# Patient Record
Sex: Female | Born: 1965 | Race: White | Hispanic: No | Marital: Married | Smoking: Current every day smoker
Health system: Southern US, Community
[De-identification: ages and names within clinical notes are randomized; demographics above are authoritative.]

## PROBLEM LIST (undated history)

## (undated) DIAGNOSIS — R519 Headache, unspecified: Secondary | ICD-10-CM

## (undated) DIAGNOSIS — R109 Unspecified abdominal pain: Secondary | ICD-10-CM

## (undated) DIAGNOSIS — K5790 Diverticulosis of intestine, part unspecified, without perforation or abscess without bleeding: Secondary | ICD-10-CM

## (undated) DIAGNOSIS — D219 Benign neoplasm of connective and other soft tissue, unspecified: Secondary | ICD-10-CM

## (undated) DIAGNOSIS — D071 Carcinoma in situ of vulva: Secondary | ICD-10-CM

## (undated) DIAGNOSIS — B009 Herpesviral infection, unspecified: Secondary | ICD-10-CM

## (undated) DIAGNOSIS — Z9889 Other specified postprocedural states: Secondary | ICD-10-CM

## (undated) DIAGNOSIS — N898 Other specified noninflammatory disorders of vagina: Secondary | ICD-10-CM

## (undated) DIAGNOSIS — N951 Menopausal and female climacteric states: Secondary | ICD-10-CM

## (undated) DIAGNOSIS — F410 Panic disorder [episodic paroxysmal anxiety] without agoraphobia: Secondary | ICD-10-CM

## (undated) DIAGNOSIS — R112 Nausea with vomiting, unspecified: Secondary | ICD-10-CM

## (undated) DIAGNOSIS — R102 Pelvic and perineal pain: Secondary | ICD-10-CM

## (undated) DIAGNOSIS — N809 Endometriosis, unspecified: Secondary | ICD-10-CM

## (undated) DIAGNOSIS — E079 Disorder of thyroid, unspecified: Secondary | ICD-10-CM

## (undated) DIAGNOSIS — Z720 Tobacco use: Secondary | ICD-10-CM

## (undated) DIAGNOSIS — R51 Headache: Secondary | ICD-10-CM

## (undated) DIAGNOSIS — N946 Dysmenorrhea, unspecified: Secondary | ICD-10-CM

## (undated) DIAGNOSIS — Q749 Unspecified congenital malformation of limb(s): Secondary | ICD-10-CM

## (undated) DIAGNOSIS — K635 Polyp of colon: Secondary | ICD-10-CM

## (undated) DIAGNOSIS — G8929 Other chronic pain: Secondary | ICD-10-CM

## (undated) DIAGNOSIS — K219 Gastro-esophageal reflux disease without esophagitis: Secondary | ICD-10-CM

## (undated) HISTORY — DX: Disorder of thyroid, unspecified: E07.9

## (undated) HISTORY — DX: Tobacco use: Z72.0

## (undated) HISTORY — DX: Dysmenorrhea, unspecified: N94.6

## (undated) HISTORY — DX: Benign neoplasm of connective and other soft tissue, unspecified: D21.9

## (undated) HISTORY — DX: Herpesviral infection, unspecified: B00.9

## (undated) HISTORY — DX: Endometriosis, unspecified: N80.9

## (undated) HISTORY — DX: Other specified noninflammatory disorders of vagina: N89.8

## (undated) HISTORY — DX: Panic disorder (episodic paroxysmal anxiety): F41.0

## (undated) HISTORY — DX: Menopausal and female climacteric states: N95.1

## (undated) HISTORY — DX: Unspecified congenital malformation of limb(s): Q74.9

## (undated) HISTORY — PX: HAND SURGERY: SHX662

## (undated) HISTORY — PX: OOPHORECTOMY: SHX86

## (undated) HISTORY — DX: Pelvic and perineal pain: R10.2

## (undated) HISTORY — DX: Unspecified abdominal pain: R10.9

## (undated) HISTORY — DX: Other chronic pain: G89.29

## (undated) HISTORY — DX: Carcinoma in situ of vulva: D07.1

## (undated) HISTORY — DX: Headache, unspecified: R51.9

## (undated) HISTORY — DX: Gastro-esophageal reflux disease without esophagitis: K21.9

## (undated) HISTORY — DX: Headache: R51

## (undated) HISTORY — PX: TUBAL LIGATION: SHX77

## (undated) HISTORY — PX: VAGINAL HYSTERECTOMY: SUR661

---

## 2007-07-24 ENCOUNTER — Ambulatory Visit: Payer: Self-pay | Admitting: Family Medicine

## 2009-12-27 ENCOUNTER — Emergency Department: Payer: Self-pay | Admitting: Emergency Medicine

## 2010-03-21 ENCOUNTER — Emergency Department: Payer: Self-pay | Admitting: Unknown Physician Specialty

## 2010-03-24 ENCOUNTER — Ambulatory Visit: Payer: Self-pay | Admitting: Internal Medicine

## 2010-03-25 ENCOUNTER — Ambulatory Visit: Payer: Self-pay | Admitting: Internal Medicine

## 2010-03-30 ENCOUNTER — Ambulatory Visit: Payer: Self-pay | Admitting: Internal Medicine

## 2010-10-22 HISTORY — PX: OTHER SURGICAL HISTORY: SHX169

## 2011-02-01 ENCOUNTER — Ambulatory Visit: Payer: Self-pay | Admitting: Internal Medicine

## 2011-02-20 ENCOUNTER — Ambulatory Visit: Payer: Self-pay | Admitting: Obstetrics and Gynecology

## 2011-02-26 ENCOUNTER — Ambulatory Visit: Payer: Self-pay | Admitting: Obstetrics and Gynecology

## 2011-03-05 LAB — PATHOLOGY REPORT

## 2011-04-02 ENCOUNTER — Ambulatory Visit: Payer: Self-pay | Admitting: Internal Medicine

## 2011-05-10 ENCOUNTER — Ambulatory Visit: Payer: Self-pay | Admitting: Obstetrics and Gynecology

## 2011-06-05 ENCOUNTER — Ambulatory Visit: Payer: Self-pay | Admitting: Obstetrics and Gynecology

## 2011-06-11 ENCOUNTER — Ambulatory Visit: Payer: Self-pay | Admitting: Obstetrics and Gynecology

## 2011-07-14 ENCOUNTER — Emergency Department: Payer: Self-pay | Admitting: Emergency Medicine

## 2011-07-16 ENCOUNTER — Ambulatory Visit (INDEPENDENT_AMBULATORY_CARE_PROVIDER_SITE_OTHER): Payer: Self-pay | Admitting: Internal Medicine

## 2011-07-16 ENCOUNTER — Encounter: Payer: Self-pay | Admitting: Internal Medicine

## 2011-07-16 VITALS — BP 112/82 | HR 92 | Temp 98.9°F | Resp 16 | Ht 62.0 in | Wt 127.2 lb

## 2011-07-16 DIAGNOSIS — Q749 Unspecified congenital malformation of limb(s): Secondary | ICD-10-CM

## 2011-07-16 DIAGNOSIS — E039 Hypothyroidism, unspecified: Secondary | ICD-10-CM

## 2011-07-16 DIAGNOSIS — M25519 Pain in unspecified shoulder: Secondary | ICD-10-CM

## 2011-07-16 MED ORDER — PREDNISONE (PAK) 10 MG PO TABS: 10.0000 mg | ORAL_TABLET | Freq: Every day | ORAL | Status: AC

## 2011-07-16 MED ORDER — HYDROCODONE-ACETAMINOPHEN 5-500 MG PO TABS: 1.0000 | ORAL_TABLET | ORAL | Status: DC | PRN

## 2011-07-16 NOTE — Progress Notes (Signed)
Subjective:    Patient ID: Kayla Sellers, female    DOB: August 18, 1966, 45 y.o.   MRN: [id number]  HPI [name] is a 45 year old female who presents for an acute visit complaining of right shoulder pain which radiates down her right arm. She reports this pain began suddenly on Thursday, approximately 5 days ago. She initially thought she had just slept on her shoulder wrong and took ibuprofen. The pain became progressively worse and she started to develop numbness down her right arm. She ultimately went to the emergency room on Saturday where she reports having a chest x-ray which was normal. She was sent home with Vicodin and told to followup with her primary care physician. She reports that the pain has been severe since that time. She continues to have numbness and pain extending down her right arm. Her strength in her upper right arm seems intact. 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 She has been taking ibuprofen and Vicodin for severe pain. She reports feeling drowsy because of the Vicodin. She denies any fever or chills. She denies any other complaints.  Outpatient Encounter Prescriptions as of 07/16/2011  Medication Sig Dispense Refill  . HYDROcodone-acetaminophen (VICODIN) 5-500 MG per tablet Take 1 tablet by mouth every 4 (four) hours as needed.  60 tablet  3  . ibuprofen (ADVIL,MOTRIN) 800 MG tablet Take 800 mg by mouth every 8 (eight) hours as needed.        Marland Kitchen levothyroxine (SYNTHROID, LEVOTHROID) 137 MCG tablet Take 137 mcg by mouth daily.        Marland Kitchen OMEPRAZOLE PO Take 1 tablet by mouth daily.        . valACYclovir (VALTREX) 500 MG tablet Take 500 mg by mouth as needed.          Review of Systems  Constitutional: Negative for fever, chills and fatigue.  HENT: Positive for neck pain.   Musculoskeletal: Positive for myalgias, back pain and arthralgias.  Neurological: Positive for numbness. Negative for dizziness, tremors and weakness.   BP 112/82  Pulse  92  Temp(Src) 98.9 F (37.2 C) (Oral)  Resp 16  Ht 5\' 2"  (1.575 m)  Wt 127 lb 4 oz (57.72 kg)  BMI 23.27 kg/m2  SpO2 98%     Objective:   Physical Exam  Constitutional: She appears well-developed and well-nourished. She appears distressed.  Musculoskeletal: Normal range of motion.       Right shoulder: She exhibits pain. She exhibits no tenderness, no bony tenderness, no swelling, no effusion, no deformity and normal strength.       Right elbow: She exhibits normal range of motion, no swelling, no effusion and no deformity. no tenderness found.       Right wrist: She exhibits deformity. She exhibits normal range of motion, no tenderness and no bony tenderness.       Arms: Neurological: She is alert. She displays no atrophy and no tremor. No sensory deficit. She exhibits normal muscle tone.  Skin: She is not diaphoretic.          Assessment & Plan:  1. Right arm pain and numbness - patient with severe pain extending from her right neck down her right arm. She also complains of numbness in her distal arm. Exam is remarkable for 5 out of 5 strength in the shoulder abductors and abductors as well as the biceps and triceps. 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 Sensation  is grossly intact in the right arm. Question if she may have ruptured disc within her cervical spine with radiculopathy. She has had minimal to no improvement with nonsteroidals and Vicodin for severe pain. She is not able to work because of the pain. We will plan to get an MRI of the cervical spine for additional evaluation. Will treat with a prednisone pack to see if any improvement in symptoms. She will continue Vicodin for severe pain. She will followup in one week.

## 2011-07-18 ENCOUNTER — Telehealth: Payer: Self-pay | Admitting: Internal Medicine

## 2011-07-18 NOTE — Telephone Encounter (Signed)
One alternative would be for [place] to set her up at [facility] for evaluation of her shoulder and arm pain.  They could probably see her in the next few days.

## 2011-07-18 NOTE — Telephone Encounter (Signed)
I wanted to get an MRI of the cervical spine for this patient. Can we try to get this soon? She is calling complaining of persistent severe pain.  Also, we can write for stronger pain medication if necessary. She was already taking vicodin, but we can try percocet if she would like. She will not be able to work and take this.

## 2011-07-18 NOTE — Telephone Encounter (Signed)
Pt called back to check to see if referral was.  She would rather not take percocet  she wants to know if she can take a cortazone shot .   Could you please call pt [phone]  Pt is upset that is taking so long to get referral

## 2011-07-18 NOTE — Telephone Encounter (Signed)
Informed pt. That the alternative would be to refer her to [facility] clinic for eval of her shoulder and pain.  She is upset that her MRI hasn't been setup or the insurance hasn't been called to approve the MRI.  I informed her that I would talk with [facility] tomorrow and see the status on this and give her a call back.

## 2011-07-18 NOTE — Telephone Encounter (Signed)
Informed patient that we wanted to get a MRI of the cervical spine for this patient.  I told her we could try a stronger pain medication like percocet but she couldn't work and take this.  She stated she hasn't worked all week.  She said that was fine but she did ask about taking steroid shot to help relieve some of the pain.  Please advise.

## 2011-07-18 NOTE — Telephone Encounter (Signed)
Pt is still hurting she has taken the pretazone 6 yesterday 5 for today and not feeling any better.

## 2011-07-19 ENCOUNTER — Telehealth: Payer: Self-pay | Admitting: Internal Medicine

## 2011-07-19 NOTE — Telephone Encounter (Signed)
I just spoke with Kayla Sellers and explained to her that I was working on the referrals just as quickly as I could.  She said well if you were in pain you would be aggravating some people as well.  I told her I would call the insurance company right now to see if i could get this pre-cert.  She then said "If you don't have time to do my referral I can go some where else" I do like Dr. Dan Humphreys, but I am in pain.

## 2011-07-20 ENCOUNTER — Ambulatory Visit: Payer: Self-pay | Admitting: Internal Medicine

## 2011-07-20 ENCOUNTER — Telehealth: Payer: Self-pay | Admitting: *Deleted

## 2011-07-20 NOTE — Telephone Encounter (Signed)
Patient has an appointment at the hospital on the 28th she is aware of appointment.

## 2011-07-20 NOTE — Telephone Encounter (Signed)
Patient is calling to see if you have received her MRI results and also says that she is still having problem with constipation and thinks that is from the vicodin. She is asking if there are any other options for her besides vicodin. Says that she does not want percocet. Please advise.

## 2011-07-20 NOTE — Telephone Encounter (Signed)
Informed pt .

## 2011-07-20 NOTE — Telephone Encounter (Signed)
I have not received MRI results. Any narcotic medications will cause constipation, so would recommend using them sparingly and using laxative such as miralax as needed.

## 2011-07-23 ENCOUNTER — Telehealth: Payer: Self-pay | Admitting: Internal Medicine

## 2011-07-23 NOTE — Telephone Encounter (Signed)
Please request this from hospital, as I do not have it.

## 2011-07-23 NOTE — Telephone Encounter (Signed)
Pt would like results of mri done last week

## 2011-07-24 ENCOUNTER — Telehealth: Payer: Self-pay | Admitting: Internal Medicine

## 2011-07-24 ENCOUNTER — Other Ambulatory Visit: Payer: Self-pay | Admitting: Internal Medicine

## 2011-07-24 DIAGNOSIS — M542 Cervicalgia: Secondary | ICD-10-CM

## 2011-07-24 NOTE — Telephone Encounter (Signed)
Message copied by Virgina Evener on Tue Jul 24, 2011  3:29 PM ------      Message from: Ronna Polio A      Created: Mon Jul 23, 2011  5:25 PM      Regarding: MRI Cervical Spine       MRI showed several areas of nerve compression, most prominently at C6-7.  We should set her up with neurosurgery.

## 2011-07-24 NOTE — Telephone Encounter (Signed)
The reason it took awhile to get the MRI set up was because I had to do a peer to peer review with another doctor to get her insurance company to approve it.  I will put order in for referral to neurosurgery and then either we can set up referral, or she can set up on her own.

## 2011-07-24 NOTE — Telephone Encounter (Signed)
Message copied by Virgina Evener on Tue Jul 24, 2011  3:33 PM ------      Message from: Ronna Polio A      Created: Mon Jul 23, 2011  5:25 PM      Regarding: MRI Cervical Spine       MRI showed several areas of nerve compression, most prominently at C6-7.  We should set her up with neurosurgery.

## 2011-07-24 NOTE — Telephone Encounter (Signed)
Informed patient that her MRI showed several areas of nerve compression, most prominently at C6-7  And we would like to set her up with neurosurgery.  She states she doesn't trust Carollee Herter because it took her so long to set the MRI up and she didn't answer her calls.  She said is it going to take five years for her to set up neurology.  The order isn't in her chart so do you put in the order or I.  Thanks

## 2011-07-24 NOTE — Telephone Encounter (Signed)
I reviewed MRI and sent a message that she needs to see neurosurgery. Can you please make sure this is set up?

## 2011-07-25 ENCOUNTER — Telehealth: Payer: Self-pay | Admitting: Internal Medicine

## 2011-07-25 NOTE — Telephone Encounter (Signed)
Patient came by and I advised her what Dr. Dan Humphreys had documented patient wanted to go ahead and get a referral made to [facility].  I went ahead and faxed over information I didn't send MRI results since I didn't have it and she was waiting for me to fill out the referral sheet and watched me fax it.  She said that she was in pain and she didn't want to wait a month to get this appointment set up. I advised her that I would call [facility] on Friday and check the status with them.  I did advise her that it might take up to a week before they could give me an appt.

## 2011-07-25 NOTE — Telephone Encounter (Signed)
Informed patient of results of MRI.

## 2011-07-25 NOTE — Telephone Encounter (Signed)
Kayla Sellers is working on her referral.

## 2011-07-25 NOTE — Telephone Encounter (Signed)
Message copied by Virgina Evener on Wed Jul 25, 2011  6:09 PM ------      Message from: Ronna Polio A      Created: Mon Jul 23, 2011  5:25 PM      Regarding: MRI Cervical Spine       MRI showed several areas of nerve compression, most prominently at C6-7.  We should set her up with neurosurgery.

## 2011-07-31 ENCOUNTER — Encounter: Payer: Self-pay | Admitting: Internal Medicine

## 2011-10-23 HISTORY — PX: OTHER SURGICAL HISTORY: SHX169

## 2011-10-26 ENCOUNTER — Other Ambulatory Visit: Payer: 59

## 2011-10-29 ENCOUNTER — Encounter: Payer: 59 | Admitting: Internal Medicine

## 2012-02-13 ENCOUNTER — Ambulatory Visit: Payer: Self-pay | Admitting: Unknown Physician Specialty

## 2012-02-14 LAB — PATHOLOGY REPORT

## 2012-02-18 ENCOUNTER — Ambulatory Visit: Payer: Self-pay | Admitting: Obstetrics and Gynecology

## 2012-02-18 LAB — CBC
HCT: 44.5 % (ref 35.0–47.0)
HGB: 15.1 g/dL (ref 12.0–16.0)
MCH: 31.9 pg (ref 26.0–34.0)
MCV: 94 fL (ref 80–100)
RBC: 4.74 10*6/uL (ref 3.80–5.20)
WBC: 7.5 10*3/uL (ref 3.6–11.0)

## 2012-02-25 ENCOUNTER — Ambulatory Visit: Payer: Self-pay | Admitting: Obstetrics and Gynecology

## 2012-02-29 LAB — PATHOLOGY REPORT

## 2012-04-02 ENCOUNTER — Ambulatory Visit: Payer: Self-pay | Admitting: Obstetrics and Gynecology

## 2012-05-13 ENCOUNTER — Ambulatory Visit: Payer: Self-pay | Admitting: Gynecologic Oncology

## 2012-05-22 ENCOUNTER — Ambulatory Visit: Payer: Self-pay | Admitting: Gynecologic Oncology

## 2014-05-13 LAB — HM PAP SMEAR: HM PAP: NEGATIVE

## 2014-08-11 ENCOUNTER — Ambulatory Visit: Payer: Self-pay | Admitting: Obstetrics and Gynecology

## 2014-08-20 ENCOUNTER — Ambulatory Visit: Payer: Self-pay | Admitting: Obstetrics and Gynecology

## 2014-08-24 ENCOUNTER — Ambulatory Visit: Payer: Self-pay | Admitting: Obstetrics and Gynecology

## 2014-08-24 LAB — HM MAMMOGRAPHY

## 2014-09-01 ENCOUNTER — Emergency Department: Payer: Self-pay | Admitting: Emergency Medicine

## 2014-09-01 ENCOUNTER — Ambulatory Visit: Payer: Self-pay

## 2014-09-23 ENCOUNTER — Ambulatory Visit: Payer: Self-pay | Admitting: Internal Medicine

## 2015-02-13 NOTE — H&P (Signed)
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  HISTORY OF PRESENT ILLNESS: Tinleigh is a 49 year old white female in a monogamous relationship, status post transvaginal hysterectomy secondary to symptomatic endometriosis who presents now for wide local excision of vulvar lesion. The patient had lesion noted just to the right of the posterior fourchette that was biopsy proven to be VIN 3/possible CIS. Margins were involved. The patient has since then undergone colposcopy of the vulva which revealed the VIN 3 lesion at the right posterior fourchette. There was a smaller lesion consistent with acetowhite epithelium approximately 1 cm in diameter just to the left of midline at the posterior fourchette. In addition, there was a faint acetowhite epithelium noted involving the medial aspect of the labia minora bilaterally. No unusual vessels were identified. No epithelial skin breakdown. The patient is occasionally symptomatic with some vulvar discomfort notable on the right side of the perineum only. She does not have any chronic itching. The patient is here for wide local excision/biopsy.   PAST MEDICAL HISTORY:  1. Hypothyroidism.  2. Chronic headaches.  3. Herpes genitalis.  4. Chronic abdominopelvic pain.  5. Dysmenorrhea.  6. Menorrhagia.  7. Endometriosis.  8. Panic disorder.  9. Right hand deformity, congenital.  10. Tobacco use disorder.   PAST SURGICAL HISTORY:  1. Right hand bone graft surgery.  2. Primary low cervical transverse cesarean section.  3. Bilateral tubal ligation.   4. Right salpingo-oophorectomy secondary to scar tissue and cyst.  5. Transvaginal hysterectomy August 2012.   PAST OB HISTORY: Para 1-1-0-2. G1 C-section at 26 weeks secondary to preterm labor. G2 VBAC 6 pounds.   PAST GYN HISTORY: Menarche age 56. The patient had no history of abnormal Pap smears. She  did have hysterectomy secondary to endometriosis. The patient does have herpes genitalis for which she uses Valtrex suppression.   FAMILY HISTORY: Negative for cancer of the colon, ovary, or breast.   SOCIAL HISTORY: The patient does smoke 1 pack of cigarettes a day. She does drink alcohol socially. She denies drug use. She works for [employer]: The patient denies recent illness. She denies coagulopathy.   PHYSICAL EXAMINATION:  VITAL SIGNS: Height 5 feet 2 inches, weight 131, blood pressure 108/74, heart rate 96, BMI 24.   GENERAL: The patient is a pleasant white female with normal affect. She is alert and oriented.   OROPHARYNX: Clear.   NECK: Supple. There is no thyromegaly or adenopathy.   LUNGS: Clear.   HEART: Regular rate and rhythm without murmur.   ABDOMEN: Soft and nontender. No organomegaly.   PELVIC:  Colposcopy of the vulva reveals acetowhite epithelium in the labia minora region bilaterally. There is a prominent acetowhite epithelium lesion in the right and left posterior fourchette region as previously described.       IMPRESSION: Wide local excision surgery with vulvar biopsies. Date of surgery 02/25/2012.    ____________________________ Alanda Slim. DeFrancesco, MD mad:bjt D: 02/18/2012 14:57:44 ET T: 02/18/2012 15:36:04 ET JOB#: [id number]  cc: Hassell Done A. DeFrancesco, MD, <Dictator> Alanda Slim DEFRANCESCO MD ELECTRONICALLY SIGNED 02/25/2012 22:38

## 2015-02-13 NOTE — Op Note (Signed)
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   OPERATIVE PROCEDURE:  1. Wide local excision x2.  2. Punch biopsy x2.   ANESTHESIA: General LMA.   SURGEON: Alanda Slim. Katrinka Herbison, MD   FIRST ASSISTANT: None.   INDICATIONS: The patient is a 49 year old white female who has vulvar biopsy-verified CIN-III/CIS of the vulva, who presents for surgical management through wide local excision. The patient has had preoperative colposcopy that did reveal a well-circumscribed lesion, approximately 1 cm in diameter, just to the right of the posterior fourchette. There also was a smaller 5 mm lesion seen just to the left of the posterior fourchette. In addition, the labia minora bilaterally demonstrated some acetowhite epithelial changes noted with placement of 5% acetic acid solution. The labia minora and majora were biopsied using 5 mm punch instruments.   DESCRIPTION OF PROCEDURE: The patient was brought to the Operating Room where she was placed in the supine position. General anesthesia was induced without difficulty using the LMA technique. She was placed in the dorsal lithotomy position using the candy cane stirrups. The perineum was draped in standard fashion. The 5% acetic acid was then used with 4 x 4's to soak the labia majora, minora and posterior fourchette region. Following several minutes of application of the acetic acid, the abnormal epithelium was noted as describecd. Wide local excision using a scalpel was performed on the 1 cm  right posterior fourchette lesion. The lesion was reapproximated using 4-0 Vicryl suture in a simple interrupted manner. Some Bovie cautery was used for hemostasis. The smaller 0.5 cm lesion to the left of the posterior  fourchette was likewise excised with a scalpel. Again, Bovie cautery was used to help with hemostasis; and subsequent use of 4-0 Vicryl suture in a simple interrupted technique was used to provide optimal hemostasis and wound closure. The labia minora abnormal changes were noted on the medial aspect of the labia, and  5 mm punch biopsy was performed x2 on the right and left labia majora, respectively. Bovie cautery again was used to facilitate hemostasis, along with simple interrupted sutures of 4-0 Vicry for skin closurel. Upon completion of the procedure, a straight catheter of 100 mL of urine was obtained with a 16-French red Robinson catheter. The perineum was cleaned, and the patient was awakened, mobilized, and taken to the recovery room in satisfactory condition.   ESTIMATED BLOOD LOSS: Minimal.   COMPLICATIONS: None.   COUNTS: All instruments, needle and sponge counts were verified as correct.   ____________________________ Alanda Slim. Netty Sullivant, MD mad:cbb D: 02/25/2012 15:37:12 ET T: 02/25/2012 16:37:56 ET JOB#: [id number]  cc: Hassell Done A. Deatra Mcmahen, MD, <Dictator> [facility] Alanda Slim Jahzion Brogden MD ELECTRONICALLY SIGNED 02/25/2012 22:44

## 2015-04-28 ENCOUNTER — Telehealth: Payer: Self-pay | Admitting: Obstetrics and Gynecology

## 2015-04-28 ENCOUNTER — Ambulatory Visit (INDEPENDENT_AMBULATORY_CARE_PROVIDER_SITE_OTHER): Payer: 59 | Admitting: Obstetrics and Gynecology

## 2015-04-28 VITALS — BP 113/77 | HR 80 | Temp 98.5°F | Ht 62.0 in | Wt 142.7 lb

## 2015-04-28 DIAGNOSIS — R309 Painful micturition, unspecified: Secondary | ICD-10-CM

## 2015-04-28 DIAGNOSIS — M545 Low back pain: Secondary | ICD-10-CM

## 2015-04-28 DIAGNOSIS — R319 Hematuria, unspecified: Secondary | ICD-10-CM

## 2015-04-28 LAB — POCT URINALYSIS DIPSTICK
BILIRUBIN UA: NEGATIVE
GLUCOSE UA: NEGATIVE
KETONES UA: NEGATIVE
Leukocytes, UA: NEGATIVE
NITRITE UA: NEGATIVE
Protein, UA: NEGATIVE
Spec Grav, UA: <=1.005
Urobilinogen, UA: 0.2
pH, UA: 6

## 2015-04-28 MED ORDER — NITROFURANTOIN MONOHYD MACRO 100 MG PO CAPS: 100.0000 mg | ORAL_CAPSULE | Freq: Two times a day (BID) | ORAL | Status: DC

## 2015-04-28 NOTE — Telephone Encounter (Signed)
Pt called office and TB put pt on nurse schedule for today.

## 2015-04-28 NOTE — Progress Notes (Signed)
Patient ID: Kayla Sellers, female   DOB: [birth year], 49 y.o.   MRN: [id number]   Pt states she has irritative voiding sx x 1 day. This a.m. she has low back pain and nauseous. NO h/o kidney stones. NO fevers.  U/a shows small blood.  Urine culture done. Pt aware to push fluids, ibup as needed, and use a heating pad. Sent in [facility]. Will f/u with culture results. Pt aware if she develops a fever, n/v/d, or severe abd pain she will need to go to the ER.   CMiller  Brayton Mars, MD

## 2015-04-28 NOTE — Telephone Encounter (Signed)
PT CALLED AND THINKS SHE HAS A UTI.Kayla Sellers IS HAVING BACK PAIN REALLY BAD AND SHE IS NAUSEA. PT WOULD LIKE A CALL BACK IN WHAT SHE NEEDS TO DO. I DID TELL HER THAT DR DE WAS OFF TODAY.

## 2015-04-29 LAB — URINE CULTURE: ORGANISM ID, BACTERIA: NO GROWTH

## 2015-05-03 ENCOUNTER — Encounter: Payer: Self-pay | Admitting: Obstetrics and Gynecology

## 2015-05-03 DIAGNOSIS — K5790 Diverticulosis of intestine, part unspecified, without perforation or abscess without bleeding: Secondary | ICD-10-CM | POA: Insufficient documentation

## 2015-05-03 DIAGNOSIS — F410 Panic disorder [episodic paroxysmal anxiety] without agoraphobia: Secondary | ICD-10-CM | POA: Insufficient documentation

## 2015-05-03 DIAGNOSIS — E079 Disorder of thyroid, unspecified: Secondary | ICD-10-CM | POA: Insufficient documentation

## 2015-05-03 DIAGNOSIS — R102 Pelvic and perineal pain: Secondary | ICD-10-CM

## 2015-05-03 DIAGNOSIS — N946 Dysmenorrhea, unspecified: Secondary | ICD-10-CM | POA: Insufficient documentation

## 2015-05-03 DIAGNOSIS — A6000 Herpesviral infection of urogenital system, unspecified: Secondary | ICD-10-CM | POA: Insufficient documentation

## 2015-05-03 DIAGNOSIS — G8929 Other chronic pain: Secondary | ICD-10-CM | POA: Insufficient documentation

## 2015-05-03 DIAGNOSIS — K635 Polyp of colon: Secondary | ICD-10-CM | POA: Insufficient documentation

## 2015-05-03 DIAGNOSIS — F17200 Nicotine dependence, unspecified, uncomplicated: Secondary | ICD-10-CM | POA: Insufficient documentation

## 2015-05-03 DIAGNOSIS — M21949 Unspecified acquired deformity of hand, unspecified hand: Secondary | ICD-10-CM | POA: Insufficient documentation

## 2015-05-03 DIAGNOSIS — R109 Unspecified abdominal pain: Secondary | ICD-10-CM

## 2015-05-03 DIAGNOSIS — N920 Excessive and frequent menstruation with regular cycle: Secondary | ICD-10-CM | POA: Insufficient documentation

## 2015-05-03 DIAGNOSIS — R51 Headache: Secondary | ICD-10-CM

## 2015-05-18 ENCOUNTER — Encounter: Payer: Self-pay | Admitting: Obstetrics and Gynecology

## 2015-05-23 ENCOUNTER — Telehealth: Payer: Self-pay | Admitting: Obstetrics and Gynecology

## 2015-05-23 NOTE — Telephone Encounter (Signed)
Pt is Dr Defrancesco's pt, she is having pain with urinating, burning and itching

## 2015-05-23 NOTE — Telephone Encounter (Signed)
Pt last visit was 04/28/15 when she was treated initially for UTI with Macrobid. Urine culture was negative. Pt states along with burning, pain with urination; she notes some itching and thick white discharge with odor. She has been taking Diflucan on and off for last month-takes another medication for toenail fungus. She also said she thought her boyfriend had been cheating on her and is concerned. Appt made for 10:30am tomorrow with Dr. Marcelline Mates.

## 2015-05-24 ENCOUNTER — Encounter: Payer: Self-pay | Admitting: Obstetrics and Gynecology

## 2015-05-24 ENCOUNTER — Ambulatory Visit (INDEPENDENT_AMBULATORY_CARE_PROVIDER_SITE_OTHER): Payer: 59 | Admitting: Obstetrics and Gynecology

## 2015-05-24 VITALS — BP 124/78 | HR 92 | Wt 139.4 lb

## 2015-05-24 DIAGNOSIS — A499 Bacterial infection, unspecified: Secondary | ICD-10-CM | POA: Diagnosis not present

## 2015-05-24 DIAGNOSIS — N760 Acute vaginitis: Secondary | ICD-10-CM

## 2015-05-24 DIAGNOSIS — B9689 Other specified bacterial agents as the cause of diseases classified elsewhere: Secondary | ICD-10-CM

## 2015-05-24 DIAGNOSIS — R399 Unspecified symptoms and signs involving the genitourinary system: Secondary | ICD-10-CM

## 2015-05-24 LAB — POCT URINALYSIS DIPSTICK
BILIRUBIN UA: NEGATIVE
Glucose, UA: NEGATIVE
KETONES UA: NEGATIVE
LEUKOCYTES UA: NEGATIVE
Nitrite, UA: NEGATIVE
PH UA: 6
PROTEIN UA: NEGATIVE
Spec Grav, UA: 1.015
UROBILINOGEN UA: NEGATIVE

## 2015-05-24 MED ORDER — FLUCONAZOLE 150 MG PO TABS: 150.0000 mg | ORAL_TABLET | Freq: Once | ORAL | Status: DC

## 2015-05-24 MED ORDER — METRONIDAZOLE 500 MG PO TABS: 500.0000 mg | ORAL_TABLET | Freq: Two times a day (BID) | ORAL | Status: DC

## 2015-05-24 NOTE — Progress Notes (Signed)
Patient ID: Kayla Sellers, female   DOB: [birth year], 49 y.o.   MRN: [id number] Having pain/burining with urination, itching and thick white vaginal discharge with odor. Pt suspects boyfriend of 75 ys has cheated on her.

## 2015-05-25 NOTE — Progress Notes (Signed)
GYNECOLOGY PROGRESS NOTE  Subjective:    Patient ID: Kayla Sellers, female    DOB: 1966/02/13, 49 y.o.   MRN: [id number]  HPI  Patient is a 49 y.o. No obstetric history on file. female who presents for complaints of dysuria and vaginal discharge over the past week.  Notes that the discharge is described as white, thick with an odor and associated itching.  Is worried about possible STD exposure.  Currently only has 1 partner of 12 years. Notes h/o UTIs and vaginitis in the past.   The following portions of the patient's history were reviewed and updated as appropriate: allergies, current medications, past family history, past medical history, past social history, past surgical history and problem list.  Review of Systems Pertinent items are noted in HPI.   Objective:   Blood pressure 124/78, pulse 92, weight 139 lb 7 oz (63.248 kg). General appearance: alert and no distress Abdomen: soft, non-tender; bowel sounds normal; no masses,  no organomegaly Pelvic: external genitalia normal and positive findings: vaginal discharge:  white, thick, odorless and small amount.  Cervix and uterus surgically absent. Adnexae nontender, non palpable.  Extremities: extremities normal, atraumatic, no cyanosis or edema.  Hypoplasia of right hand.  Neurologic: Grossly normal   Labs:   Urinalysis Results for Kayla Sellers (MRN [id number]) as of 05/25/2015 08:36  Ref. Range 05/24/2015 11:25  Bilirubin, UA Unknown negative  Clarity, UA Unknown clear  Color, UA Unknown yellow  Glucose Unknown negative  Ketones, UA Unknown negative  Leukocytes, UA Latest Ref Range: Negative  Negative  Nitrite, UA Unknown negative  pH, UA Unknown 6.0  Protein, UA Unknown negative  RBC, UA Unknown small  Specific Gravity, UA Unknown 1.015  Urobilinogen, UA Unknown negative    Microscopic wet-mount exam shows KOH done, clue cells. No trichomonads or yeast.    Assessment:   Bacterial vaginosis UTI  symptoms  Plan:   Will prescribe Flagyl 500 mg PO x 7 days. Will also prescribe dose of Diflucan to take as patient with h/o yeast infections after antibiotic use. UA negative, but will send for culture due to patient's h/o recurrent UTIs and urinary symptoms. F/u if symptoms worsen or fail to improve.    Rubie Maid, MD [facility]

## 2015-05-26 ENCOUNTER — Telehealth: Payer: Self-pay | Admitting: Obstetrics and Gynecology

## 2015-05-26 ENCOUNTER — Emergency Department
Admission: EM | Admit: 2015-05-26 | Discharge: 2015-05-26 | Discharge status: Against Medical Advice | Payer: 59 | Attending: Emergency Medicine | Admitting: Emergency Medicine

## 2015-05-26 ENCOUNTER — Encounter: Payer: Self-pay | Admitting: Emergency Medicine

## 2015-05-26 DIAGNOSIS — R600 Localized edema: Secondary | ICD-10-CM | POA: Diagnosis present

## 2015-05-26 DIAGNOSIS — R51 Headache: Secondary | ICD-10-CM | POA: Diagnosis not present

## 2015-05-26 DIAGNOSIS — Z720 Tobacco use: Secondary | ICD-10-CM | POA: Insufficient documentation

## 2015-05-26 LAB — URINE CULTURE: ORGANISM ID, BACTERIA: NO GROWTH

## 2015-05-26 MED ORDER — CLINDAMYCIN HCL 300 MG PO CAPS: 300.0000 mg | ORAL_CAPSULE | Freq: Two times a day (BID) | ORAL | Status: DC

## 2015-05-26 NOTE — ED Notes (Signed)
Patient to ER for c/o possible allergic reaction to medicine. Patient states she feels like her face is swelling. Denies any difficulty breathing. Patient reports headache. Face does not appear swollen, but patient reports sensation of swelling.

## 2015-05-26 NOTE — Telephone Encounter (Signed)
Kayla Sellers reacted to the flagyl medication,, after taking benadyrl her symptoms subsided. She wants to know what she needs to do now.

## 2015-05-26 NOTE — Telephone Encounter (Signed)
Pt states she took Benadryl x2, once before ER and once while there and her symptoms were improving. Talked with ER nurse and went home since she was better. Pt notified that uc negative and nu-swab results not back yet. New Rx sent to pharmacy per Dr. Marcelline Mates for Clindamycin 300 mg bid x7d. Pt encouraged to go to ER if any symptoms return, along with taking the Benadryl.

## 2015-05-27 ENCOUNTER — Other Ambulatory Visit: Payer: Self-pay | Admitting: Obstetrics and Gynecology

## 2015-05-27 LAB — NUSWAB VAGINITIS PLUS (VG+)
Atopobium vaginae: HIGH Score — AB
CANDIDA GLABRATA, NAA: NEGATIVE
CHLAMYDIA TRACHOMATIS, NAA: NEGATIVE
Megasphaera 1: HIGH Score — AB
NEISSERIA GONORRHOEAE, NAA: NEGATIVE

## 2015-06-02 ENCOUNTER — Telehealth: Payer: Self-pay | Admitting: Obstetrics and Gynecology

## 2015-06-02 MED ORDER — VALACYCLOVIR HCL 500 MG PO TABS: 500.0000 mg | ORAL_TABLET | Freq: Every day | ORAL | Status: DC

## 2015-06-02 NOTE — Telephone Encounter (Signed)
Pt aware nuswab showed bv. Pt can not have flagyl (allergic reaction) now on clindamycin with sx relief. Valtrex rx erx.

## 2015-06-02 NOTE — Telephone Encounter (Signed)
Patient called requesting results from her most recent lab work as well as a refill on valtrex. Thanks

## 2015-06-21 ENCOUNTER — Ambulatory Visit (INDEPENDENT_AMBULATORY_CARE_PROVIDER_SITE_OTHER): Payer: 59 | Admitting: Obstetrics and Gynecology

## 2015-06-21 ENCOUNTER — Encounter: Payer: Self-pay | Admitting: Obstetrics and Gynecology

## 2015-06-21 VITALS — BP 104/69 | HR 76 | Ht 62.0 in | Wt 142.0 lb

## 2015-06-21 DIAGNOSIS — R102 Pelvic and perineal pain: Secondary | ICD-10-CM

## 2015-06-21 DIAGNOSIS — Z1231 Encounter for screening mammogram for malignant neoplasm of breast: Secondary | ICD-10-CM

## 2015-06-21 DIAGNOSIS — N951 Menopausal and female climacteric states: Secondary | ICD-10-CM | POA: Diagnosis not present

## 2015-06-21 NOTE — Patient Instructions (Signed)
1.  Pap smear not done (not needed this year). 2.  Mammogram ordered. 3.  Routine labs should be obtained through primary care. 4.  FSH and serum estradiol level are drawn today to assess for menopause. 5.  Return in 1 year or when necessary.

## 2015-06-21 NOTE — Progress Notes (Signed)
Patient ID: Kayla Sellers, female   DOB: 1965/11/02, 49 y.o.   MRN: [id number] ANNUAL PREVENTATIVE CARE GYN  ENCOUNTER NOTE  Subjective:       Kayla Sellers is a 49 y.o. No obstetric history on file. female here for a routine annual gynecologic exam.  Current complaints: 1.  Vaginal dryness 2.  Dyspareunia  Kayla Sellers reports chronic right lower quadrant pelvic discomfort, which has been ongoing for months.  Bowel function is normal.  Bladder function is normal.  She did have colonoscopy in the past which revealed polyps.  She is status post laparoscopic RSO for chronic pelvic pain/endometriosis/pelvic adhesive disease.  She is having occasional early morning hot flashes.  She reports ongoing vaginal dryness and is comfortable with intercourse.   Gynecologic History No LMP recorded. Patient has had a hysterectomy. TVH Contraception: status post hysterectomy Last Pap: 7/ 2015 neg/neg. Results were: normal Last mammogram: 11/ 2015 birad 1. Results were: normal Status post RSO  Obstetric History OB History  No data available    Past Medical History  Diagnosis Date  . Limb deformities, congenital   . Thyroid disease     hypothyroidism  . VIN III (vulvar intraepithelial neoplasia III)     s/p wle  . Chronic pelvic pain in female   . Fibroid   . Dysmenorrhea   . Endometriosis   . Panic disorder   . HA (headache)   . Left flank pain   . HSV-2 (herpes simplex virus 2) infection   . Tobacco user   . Acid reflux   . Vaginal dryness   . Menopausal state     Past Surgical History  Procedure Laterality Date  . Wide local excision  2013    pos margin  . Vaginal hysterectomy    . Oophorectomy Right   . Hand surgery Right     bone graft  . Tubal ligation    . Cesarean section      Current Outpatient Prescriptions on File Prior to Visit  Medication Sig Dispense Refill  . ibuprofen (ADVIL,MOTRIN) 800 MG tablet Take 800 mg by mouth every 8 (eight) hours as needed.      Marland Kitchen  levothyroxine (SYNTHROID, LEVOTHROID) 137 MCG tablet Take 137 mcg by mouth daily.      Marland Kitchen OMEPRAZOLE PO Take 1 tablet by mouth daily.      . valACYclovir (VALTREX) 500 MG tablet Take 1 tablet (500 mg total) by mouth daily. 30 tablet 11  . Vitamin D, Cholecalciferol, 1000 UNITS CAPS Take 1,000 mg by mouth.     No current facility-administered medications on file prior to visit.    Allergies  Allergen Reactions  . Flagyl [Metronidazole] Anaphylaxis  . Iodinated Diagnostic Agents Anaphylaxis  . Sulfa Drugs Cross Reactors     hives    Social History   Social History  . Marital Status: Divorced    Spouse Name: N/A  . Number of Children: N/A  . Years of Education: N/A   Occupational History  . Not on file.   Social History Main Topics  . Smoking status: Current Every Day Smoker -- 1.00 packs/day    Types: Cigarettes  . Smokeless tobacco: Never Used  . Alcohol Use: Yes     Comment: socially  . Drug Use: No  . Sexual Activity: Yes    Birth Control/ Protection: None   Other Topics Concern  . Not on file   Social History Narrative    Family History  Problem Relation  Age of Onset  . Diabetes Paternal Aunt   . Heart disease Paternal Grandfather   . Cancer Neg Hx     The following portions of the patient's history were reviewed and updated as appropriate: allergies, current medications, past family history, past medical history, past social history, past surgical history and problem list.  Review of Systems ROS Review of Systems - General ROS: negative for - chills, fatigue, fever, hot flashes, night sweats, weight gain or weight loss Psychological ROS: negative for - anxiety, decreased libido, depression, mood swings, physical abuse or sexual abuse Ophthalmic ROS: negative for - blurry vision, eye pain or loss of vision ENT ROS: negative for - headaches, hearing change, visual changes or vocal changes Allergy and Immunology ROS: negative for - hives, itchy/watery eyes or  seasonal allergies Hematological and Lymphatic ROS: negative for - bleeding problems, bruising, swollen lymph nodes or weight loss Endocrine ROS: negative for - galactorrhea, hair pattern changes, hot flashes, malaise/lethargy, mood swings, palpitations, polydipsia/polyuria, skin changes, temperature intolerance or unexpected weight changes Breast ROS: negative for - new or changing breast lumps or nipple discharge Respiratory ROS: negative for - cough or shortness of breath Cardiovascular ROS: negative for - chest pain, irregular heartbeat, palpitations or shortness of breath Gastrointestinal ROS: no abdominal pain, change in bowel habits, or black or bloody stools Genito-Urinary ROS: no dysuria, trouble voiding, or hematuria Musculoskeletal ROS: negative for - joint pain or joint stiffness Neurological ROS: negative for - bowel and bladder control changes Dermatological ROS: negative for rash and skin lesion changes   Objective:   BP 104/69 mmHg  Pulse 76  Ht 5\' 2"  (1.575 m)  Wt 142 lb (64.411 kg)  BMI 25.97 kg/m2 CONSTITUTIONAL: Well-developed, well-nourished female in no acute distress.  PSYCHIATRIC: Normal mood and affect. Normal behavior. Normal judgment and thought content. [place]: Alert and oriented to person, place, and time. Normal muscle tone coordination. No cranial nerve deficit noted. HENT:  Normocephalic, atraumatic, External right and left ear normal. Oropharynx is clear and moist EYES: Conjunctivae and EOM are normal. Pupils are equal, round, and reactive to light. No scleral icterus.  NECK: Normal range of motion, supple, no masses.  Normal thyroid.  SKIN: Skin is warm and dry. No rash noted. Not diaphoretic. No erythema. No pallor. CARDIOVASCULAR: Normal heart rate noted, regular rhythm, no murmur. RESPIRATORY: Clear to auscultation bilaterally. Effort and breath sounds normal, no problems with respiration noted. BREASTS: Symmetric in size. No masses, skin changes,  nipple drainage, or lymphadenopathy. ABDOMEN: Soft, normal bowel sounds, no distention noted.  No tenderness, rebound or guarding.  BLADDER: Normal PELVIC:  External Genitalia: Normal  BUS: Normal  Vagina: Normal; vaginal cuff intact; mild tenderness in right lower quadrant with a sense of fullness without discrete mass  Cervix: surgically absent  Uterus: surgically absent  Adnexa: mild right lower quadrant tenderness as noted above; left lower quadrant, nonpalpable, nontender  RV: External Exam NormaI, No Rectal Masses and Normal Sphincter tone  MUSCULOSKELETAL: Normal range of motion. No tenderness.  No cyanosis, clubbing, or edema.  2+ distal pulses. LYMPHATIC: No Axillary, Supraclavicular, or Inguinal Adenopathy.    Assessment:   Annual gynecologic examination 49 y.o. Contraception: status post hysterectomyTVH Status post laparoscopic RSO Chronic pelvic pain, right lower quadrant History of colonoscopy with findings of polyps several years ago. Bowel function normal. Vaginal dryness and dyspareunia; clinical exam does not demonstrate significant vaginal atrophy Normal BMI   Plan:  Pap: Not needed Mammogram: Ordered Stool Guaiac Testing:  Not  Indicated Labs: thru pcp Routine preventative health maintenance measures emphasized: Exercise/Diet/Weight control, Tobacco Warnings and Alcohol/Substance use risks FSH and serum estradiol level ordered Return to [place], CMA Brayton Mars, MD

## 2015-06-23 LAB — ESTRADIOL: ESTRADIOL: 241 pg/mL

## 2015-06-23 LAB — FOLLICLE STIMULATING HORMONE: FSH: 7.3 m[IU]/mL

## 2015-07-27 ENCOUNTER — Emergency Department
Admission: EM | Admit: 2015-07-27 | Discharge: 2015-07-27 | Discharge status: Against Medical Advice | Payer: 59 | Attending: Emergency Medicine | Admitting: Emergency Medicine

## 2015-07-27 ENCOUNTER — Encounter: Payer: Self-pay | Admitting: *Deleted

## 2015-07-27 DIAGNOSIS — Z720 Tobacco use: Secondary | ICD-10-CM | POA: Diagnosis not present

## 2015-07-27 DIAGNOSIS — I10 Essential (primary) hypertension: Secondary | ICD-10-CM | POA: Insufficient documentation

## 2015-07-27 NOTE — ED Notes (Signed)
Patient has decided to go ahead and leave. Encouraged patient to stay but she wants to go home.

## 2015-07-27 NOTE — ED Notes (Signed)
Patient does not wish to be seen. Feels like her symptoms are subsiding and her anxiety is better.

## 2015-07-27 NOTE — ED Notes (Addendum)
Pt has a sinus infection and was started on sinus meds today  States blood pressure elevated and has nausea. Pt also reports a headache and anxiety.  Alert.  Speech clear

## 2015-11-10 ENCOUNTER — Encounter: Payer: Self-pay | Admitting: Obstetrics and Gynecology

## 2015-11-10 ENCOUNTER — Ambulatory Visit (INDEPENDENT_AMBULATORY_CARE_PROVIDER_SITE_OTHER): Payer: 59 | Admitting: Obstetrics and Gynecology

## 2015-11-10 VITALS — BP 117/76 | HR 102 | Ht 62.0 in | Wt 132.7 lb

## 2015-11-10 DIAGNOSIS — D071 Carcinoma in situ of vulva: Secondary | ICD-10-CM | POA: Diagnosis not present

## 2015-11-10 DIAGNOSIS — R300 Dysuria: Secondary | ICD-10-CM

## 2015-11-10 DIAGNOSIS — N9489 Other specified conditions associated with female genital organs and menstrual cycle: Secondary | ICD-10-CM | POA: Diagnosis not present

## 2015-11-10 DIAGNOSIS — Z90710 Acquired absence of both cervix and uterus: Secondary | ICD-10-CM | POA: Diagnosis not present

## 2015-11-10 DIAGNOSIS — R319 Hematuria, unspecified: Secondary | ICD-10-CM

## 2015-11-10 LAB — POCT URINALYSIS DIPSTICK
BILIRUBIN UA: NEGATIVE
Glucose, UA: NEGATIVE
KETONES UA: 1
LEUKOCYTES UA: NEGATIVE
Nitrite, UA: NEGATIVE
PH UA: 7
Protein, UA: NEGATIVE
SPEC GRAV UA: 1.01
Urobilinogen, UA: 0.2

## 2015-11-10 MED ORDER — NYSTATIN-TRIAMCINOLONE 100000-0.1 UNIT/GM-% EX CREA: 1.0000 "application " | TOPICAL_CREAM | Freq: Two times a day (BID) | CUTANEOUS | Status: DC

## 2015-11-10 NOTE — Progress Notes (Signed)
Chief complaint: 1.  Vulvar burning 2.  Vaginal discharge  Patient presents for evaluation. She has a history of VIN 3, status post wide local excision; last colposcopy was approximately 1 year ago. Patient also has history of HSV; she does not recall any recent prodromal symptoms or outbreaks.  Past medical history: Past surgical history, problems, medications, and allergies are reviewed.  Review of systems: Per HPI.  OBJECTIVE: BP 117/76 mmHg  Pulse 102  Ht 5\' 2"  (1.575 m)  Wt 132 lb 11.2 oz (60.192 kg)  BMI 24.26 kg/m2 Pleasant, well-appearing white female in no acute distress. Abdomen: Soft, nontender. Pelvic exam: External genitalia-bilateral hyperemia, minimal, without cellulitis lesions or epithelial skin breakdown. BUS-normal. Vagina-mucoid white discharge, no malodor. Cervix-surgically absent Uterus surgically absent.  Procedure: Wet prep. Normal saline-negative. KOH-negative.  ASSESSMENT: 1.  Vulvar burning, hyperemia on exam, possibly consistent with monilia infection. 2.  Wet prep negative for pathogens. 3.  History of VIN 3 without recent colposcopy (last colposcopy approximately 1 year ago). 4.  History of HSV-2 without prodromal symptoms or recent outbreak.  PLAN: 1.  Urinalysis and culture to rule out UTI. 2.  Wet prep-negative. 3.  Nystatin/triamcinolone cream to be applied topically twice a day for 14 days.  She is to contact us if symptoms do not resolve. 4.  Return in 3 months for vulvar colposcopy.  A total of 15 minutes were spent face-to-face with the patient during this encounter and over half of that time dealt with counseling and coordination of care.  Brayton Mars, MD  Note: This dictation was prepared with Dragon dictation along with smaller phrase technology. Any transcriptional errors that result from this process are unintentional.

## 2015-11-10 NOTE — Patient Instructions (Signed)
1.  Nystatin/triamcinolone cream is to be applied topically twice a day for 14 days. 2.  Return in 3 months for colposcopy.  If vulvar burning persists. 3.  The slide today did not show any bacterial infection

## 2015-11-12 LAB — URINE CULTURE

## 2015-11-15 ENCOUNTER — Telehealth: Payer: Self-pay

## 2015-11-15 NOTE — Telephone Encounter (Signed)
-----   Message from Brayton Mars, MD sent at 11/13/2015 10:04 PM EST ----- Please notify - Abnormal Labs Please call in [place] twic daily for 7 days.

## 2015-11-16 ENCOUNTER — Telehealth: Payer: Self-pay

## 2015-11-16 MED ORDER — NITROFURANTOIN MONOHYD MACRO 100 MG PO CAPS: 100.0000 mg | ORAL_CAPSULE | Freq: Two times a day (BID) | ORAL | Status: DC

## 2015-11-16 NOTE — Telephone Encounter (Signed)
-----   Message from Brayton Mars, MD sent at 11/13/2015 10:04 PM EST ----- Please notify - Abnormal Labs Please call in [place] twic daily for 7 days.

## 2015-11-16 NOTE — Telephone Encounter (Signed)
Pt aware. Med erx. 

## 2015-11-23 ENCOUNTER — Telehealth: Payer: Self-pay | Admitting: Obstetrics and Gynecology

## 2015-11-23 NOTE — Telephone Encounter (Signed)
Pt aware needs appt. atb should clear up uti- If still having sx needs to be seen.- appt made 2/2/ 8:30

## 2015-11-23 NOTE — Telephone Encounter (Signed)
PT WAS HERE LAST WEEK FOR A UTI AND SHEIS ON ANTIBIOTIC AND SHE HAS ONE PILL LEFT AND SHE IS NOT ANY BETTER, PT WOULD LIKE A CALL BACK ON WHAT SHE NEEDS TO DO.

## 2015-11-24 ENCOUNTER — Ambulatory Visit (INDEPENDENT_AMBULATORY_CARE_PROVIDER_SITE_OTHER): Payer: 59 | Admitting: Obstetrics and Gynecology

## 2015-11-24 ENCOUNTER — Encounter: Payer: Self-pay | Admitting: Obstetrics and Gynecology

## 2015-11-24 VITALS — BP 122/79 | HR 106 | Ht 62.0 in | Wt 131.0 lb

## 2015-11-24 DIAGNOSIS — D071 Carcinoma in situ of vulva: Secondary | ICD-10-CM

## 2015-11-24 DIAGNOSIS — N9489 Other specified conditions associated with female genital organs and menstrual cycle: Secondary | ICD-10-CM | POA: Diagnosis not present

## 2015-11-24 DIAGNOSIS — F419 Anxiety disorder, unspecified: Secondary | ICD-10-CM

## 2015-11-24 DIAGNOSIS — N390 Urinary tract infection, site not specified: Secondary | ICD-10-CM

## 2015-11-24 DIAGNOSIS — N951 Menopausal and female climacteric states: Secondary | ICD-10-CM | POA: Diagnosis not present

## 2015-11-24 LAB — POCT URINALYSIS DIPSTICK
BILIRUBIN UA: NEGATIVE
Glucose, UA: NEGATIVE
KETONES UA: NEGATIVE
Leukocytes, UA: NEGATIVE
Nitrite, UA: NEGATIVE
PH UA: 6
Protein, UA: NEGATIVE
Urobilinogen, UA: 0.2

## 2015-11-24 MED ORDER — CITALOPRAM HYDROBROMIDE 20 MG PO TABS: 20.0000 mg | ORAL_TABLET | Freq: Every day | ORAL | Status: DC

## 2015-11-24 MED ORDER — FLUCONAZOLE 150 MG PO TABS: 150.0000 mg | ORAL_TABLET | Freq: Every day | ORAL | Status: DC

## 2015-11-24 NOTE — Patient Instructions (Signed)
1.  Start Celexa 20 mg a day.  For the first week, take one half tablet a day. 2.  Urine culture is sent today for analysis. 3.  Vulvar colposcopy was done along with 2 biopsies.  Return in 1 week for follow-up on biopsies and further management planning. 4.  Return in 6 weeks for follow-up on Celexa

## 2015-11-24 NOTE — Progress Notes (Signed)
GYN ENCOUNTER NOTE  Subjective:       Kayla Sellers is a 50 y.o. No obstetric history on file. female is here for gynecologic evaluation of the following issues:  1. Persistent UTI symptoms: Still with frequent urination, burning when urinating. Completed abx treatment yesterday.  2. Vulvar burning: Constant burning. Had started the Nystatin topical cream, discontinued after 4-5 days when starting abx for UTI.  3. Anxiety   Gynecologic History No LMP recorded. Patient has had a hysterectomy. s/p TVH-RSO Contraception: status post hysterectomy Last Pap: 7/ 2015 neg/neg. Results were: normal Last mammogram: ordered on 06/21/2015. Result unavailable.  Obstetric History OB History  No data available    Past Medical History  Diagnosis Date  . Limb deformities, congenital   . Thyroid disease     hypothyroidism  . VIN III (vulvar intraepithelial neoplasia III)     s/p wle  . Chronic pelvic pain in female   . Fibroid   . Dysmenorrhea   . Endometriosis   . Panic disorder   . HA (headache)   . Left flank pain   . HSV-2 (herpes simplex virus 2) infection   . Tobacco user   . Acid reflux   . Vaginal dryness   . Menopausal state     Past Surgical History  Procedure Laterality Date  . Wide local excision  2013    pos margin  . Vaginal hysterectomy    . Oophorectomy Right   . Hand surgery Right     bone graft  . Tubal ligation    . Cesarean section      Current Outpatient Prescriptions on File Prior to Visit  Medication Sig Dispense Refill  . ibuprofen (ADVIL,MOTRIN) 800 MG tablet Take 800 mg by mouth every 8 (eight) hours as needed.      Marland Kitchen levothyroxine (SYNTHROID, LEVOTHROID) 137 MCG tablet Take 137 mcg by mouth daily.      . valACYclovir (VALTREX) 500 MG tablet Take 1 tablet (500 mg total) by mouth daily. 30 tablet 11  . Vitamin D, Cholecalciferol, 1000 UNITS CAPS Take 1,000 mg by mouth.     No current facility-administered medications on file prior to visit.     Allergies  Allergen Reactions  . Flagyl [Metronidazole] Anaphylaxis  . Iodinated Diagnostic Agents Anaphylaxis  . Sulfa Drugs Cross Reactors     hives    Social History   Social History  . Marital Status: Divorced    Spouse Name: N/A  . Number of Children: N/A  . Years of Education: N/A   Occupational History  . Not on file.   Social History Main Topics  . Smoking status: Current Every Day Smoker -- 1.00 packs/day    Types: Cigarettes  . Smokeless tobacco: Never Used  . Alcohol Use: Yes     Comment: socially  . Drug Use: No  . Sexual Activity: Yes    Birth Control/ Protection: None   Other Topics Concern  . Not on file   Social History Narrative    Family History  Problem Relation Age of Onset  . Diabetes Paternal Aunt   . Heart disease Paternal Grandfather   . Cancer Neg Hx     The following portions of the patient's history were reviewed and updated as appropriate: allergies, current medications, past family history, past medical history, past social history, past surgical history and problem list.  Review of Systems See above  Objective:   BP 122/79 mmHg  Pulse 106  Ht 5'  2" (1.575 m)  Wt 131 lb (59.421 kg)  BMI 23.95 kg/m2 CONSTITUTIONAL: Well-developed, well-nourished female in no acute distress.  [place]: Alert and oriented to person, place, and time. PSYCHIATRIC: Normal mood and affect. Normal behavior. Normal judgment and thought content. Increased anxiety due to family problems.  PELVIC:  External Genitalia: Normal  BUS: Normal  Vagina: Normal  Cervix: Normal  Uterus: Normal size, shape,consistency, mobile  Adnexa: Normal  RV: Normal   Bladder: Nontender  PROCEDURE: Vulvar colposcopy Acetic acid 4 x 4's are placed on the perineum for several minutes.  Following removal, inspection with colposcopy demonstrates faint acetowhite changes noted at the posterior fourchette and along the right labia minora medial aspect.  No other  abnormalities were seen. 2.  3.5 mm punch biopsies are taken:  Posterior fourchette   Right labia minora, medial aspect  PROCEDURE: Wet prep. Normal saline-negative. KOH-negative     Assessment:   1. Recurrent UTI - POCT urinalysis dipstick - Urine culture  2. Perimenopausal vasomotor symptoms - Follicle stimulating hormone Right labia minora medial aspect  3. Anxiety - h/o Zoloft trial with no improvement -Start citalopram 20 mg daily  4. VIN III (vulvar intraepithelial neoplasia III) -Colposcopy with Vulvar biopsy 2  5. Vulvar burning -Diflucan     Plan:   1. Repeat urine culture for analysis 2. Vulvar colposcopy done with 2 biopsies taken. Follow up in 1 week for biopsy results and further management. 3. Start Celexa 10mg  daily for 1 week, then switch to 20 mg daily PO. Side effects and efficacy of medication discussed in detail with patient. 4. Follow up in 6 weeks for follow up on Celexa.  5. Start Diflucan 150mg  PO x1 dose for Suspected monilia 6. Plan for mammogram this year during annual physical.   A total of 25 minutes were spent face-to-face with the patient during this encounter and over half of that time involved counseling and coordination of care.   Cathlean Sauer, PA-S Brayton Mars, MD   I have seen, interviewed, and examined the patient in conjunction with the [facility].A. student and affirm the diagnosis and management plan. Martin A. DeFrancesco, MD, FACOG   Note: This dictation was prepared with Dragon dictation along with smaller phrase technology. Any transcriptional errors that result from this process are unintentional.

## 2015-11-24 NOTE — Addendum Note (Signed)
Addended by: Elouise Munroe on: 11/24/2015 01:33 PM   Modules accepted: Orders

## 2015-11-25 ENCOUNTER — Telehealth: Payer: Self-pay | Admitting: Obstetrics and Gynecology

## 2015-11-25 LAB — FOLLICLE STIMULATING HORMONE: FSH: 8.8 m[IU]/mL

## 2015-11-25 LAB — URINE CULTURE: ORGANISM ID, BACTERIA: NO GROWTH

## 2015-11-25 NOTE — Telephone Encounter (Signed)
Spoke with pt, she states that she took half of a 20mg  citalopram last night at 8:00pm. Pt states that she is now feeling very lightheaded, nauseous, and "high". Pt states she did try to eat this morning but was only able to take a few bites. Per pt she has a history of this type of reaction with anti anxiety/depression medications. Advised pt to leave work (has help with transportation) go home, rest, push fluids. Advised pt that it was ok to take some Phenergan she was previously prescribed for nausea as long as the RX was still in date. Pt states at this time she is not interested in initiating another medication for anxiety/depression. Advised pt that I would speak with Everly M or Dr.De as soon as possible, will follow up with pt shortly.

## 2015-11-25 NOTE — Telephone Encounter (Signed)
PT CALLED AND DR DE PUT HER ON A NEW ANTIDEPRESSENT AND SHE TOOK HALF LAST NIGHT AND SHE DOES NOT LIKE IT, [facility] FEEL SLIKE SHE IS HIGH AS A KITE AND SHE JUST DOESN'T CARE AND SHE DOES NOT LIKE IT SO SHE WANTED A CALL BACK ON WHAT SHE NEEDS TO DO.

## 2015-11-25 NOTE — Telephone Encounter (Signed)
Per mad ok for pt to stop celexa. Offered referral to therapy. Pt declines at this time. 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

## 2015-11-29 LAB — PATHOLOGY

## 2015-11-30 ENCOUNTER — Ambulatory Visit (INDEPENDENT_AMBULATORY_CARE_PROVIDER_SITE_OTHER): Payer: 59 | Admitting: Obstetrics and Gynecology

## 2015-11-30 ENCOUNTER — Other Ambulatory Visit: Payer: 59

## 2015-11-30 ENCOUNTER — Encounter: Payer: Self-pay | Admitting: Obstetrics and Gynecology

## 2015-11-30 VITALS — Ht 62.0 in | Wt 124.7 lb

## 2015-11-30 DIAGNOSIS — D071 Carcinoma in situ of vulva: Secondary | ICD-10-CM | POA: Diagnosis not present

## 2015-11-30 DIAGNOSIS — N9489 Other specified conditions associated with female genital organs and menstrual cycle: Secondary | ICD-10-CM | POA: Diagnosis not present

## 2015-11-30 DIAGNOSIS — L309 Dermatitis, unspecified: Secondary | ICD-10-CM | POA: Diagnosis not present

## 2015-11-30 DIAGNOSIS — L308 Other specified dermatitis: Secondary | ICD-10-CM

## 2015-11-30 MED ORDER — LIDOCAINE 0.5 % EX GEL: 0.5000 g | Freq: Four times a day (QID) | CUTANEOUS | Status: DC

## 2015-11-30 MED ORDER — CLOBETASOL PROPIONATE 0.05 % EX OINT: 1.0000 "application " | TOPICAL_OINTMENT | Freq: Two times a day (BID) | CUTANEOUS | Status: DC

## 2015-11-30 NOTE — Progress Notes (Signed)
GYN ENCOUNTER NOTE  Subjective:       Kayla Sellers is a 50 y.o. No obstetric history on file. female is here for gynecologic evaluation of the following issues:  1.vulvar biopsy results.   2.  Chronic vulvitis (burning)   50 yo female presents today for vulvar biopsy results. The biopsy on 11/24/2015 revealed a psoriasiform and spongiotic tissue reaction, negative for malignancy. The patient no longer has UTI symptoms, although she experiences slight discomfort with urination due to the biopsy site. The patient is scheduled for an endoscopy for further evaluation of a possible ulcer. No other complaints at this time.    Gynecologic History No LMP recorded. Patient has had a hysterectomy.   Obstetric History OB History  No data available    Past Medical History  Diagnosis Date  . Limb deformities, congenital   . Thyroid disease     hypothyroidism  . VIN III (vulvar intraepithelial neoplasia III)     s/p wle  . Chronic pelvic pain in female   . Fibroid   . Dysmenorrhea   . Endometriosis   . Panic disorder   . HA (headache)   . Left flank pain   . HSV-2 (herpes simplex virus 2) infection   . Tobacco user   . Acid reflux   . Vaginal dryness   . Menopausal state     Past Surgical History  Procedure Laterality Date  . Wide local excision  2013    pos margin  . Vaginal hysterectomy    . Oophorectomy Right   . Hand surgery Right     bone graft  . Tubal ligation    . Cesarean section      Current Outpatient Prescriptions on File Prior to Visit  Medication Sig Dispense Refill  . ibuprofen (ADVIL,MOTRIN) 800 MG tablet Take 800 mg by mouth every 8 (eight) hours as needed.      Marland Kitchen levothyroxine (SYNTHROID, LEVOTHROID) 137 MCG tablet Take 137 mcg by mouth daily.      . RABEprazole (ACIPHEX) 20 MG tablet TAKE 1 TABLET (20 MG TOTAL) BY MOUTH 2 (TWO) TIMES DAILY.  3  . valACYclovir (VALTREX) 500 MG tablet Take 1 tablet (500 mg total) by mouth daily. 30 tablet 11  . Vitamin  D, Cholecalciferol, 1000 UNITS CAPS Take 1,000 mg by mouth.     No current facility-administered medications on file prior to visit.    Allergies  Allergen Reactions  . Flagyl [Metronidazole] Anaphylaxis  . Iodinated Diagnostic Agents Anaphylaxis  . Sulfa Drugs Cross Reactors     hives    Social History   Social History  . Marital Status: Divorced    Spouse Name: N/A  . Number of Children: N/A  . Years of Education: N/A   Occupational History  . Not on file.   Social History Main Topics  . Smoking status: Current Every Day Smoker -- 1.00 packs/day    Types: Cigarettes  . Smokeless tobacco: Never Used  . Alcohol Use: Yes     Comment: socially  . Drug Use: No  . Sexual Activity: Yes    Birth Control/ Protection: None   Other Topics Concern  . Not on file   Social History Narrative    Family History  Problem Relation Age of Onset  . Diabetes Paternal Aunt   . Heart disease Paternal Grandfather   . Cancer Neg Hx     The following portions of the patient's history were reviewed and updated as appropriate:  allergies, current medications, past family history, past medical history, past social history, past surgical history and problem list.  Review of Systems  Gastrointestinal ROS: See above. Genito-Urinary ROS: See above.  Objective:   Ht 5\' 2"  (1.575 m)  Wt 124 lb 11.2 oz (56.564 kg)  BMI 22.80 kg/m2 CONSTITUTIONAL: Well-developed, well-nourished female in no acute distress.  HENT:  Normocephalic, atraumatic.  [place]: Alert and oriented to person, place, and time.  PSYCHIATRIC: Anxious. CARDIOVASCULAR: Normal s1/s2, no m/r/g RESPIRATORY: Clear to auscultation PELVIC:  Vulvar biopsy sites are healing appropriately.  Granulation tissue ongoing; no evidence of inflammation.  PATHOLOGY: VULVA, POSTERIOR, BIOPSY:  MINIMAL TO MILD PSORIASIFORM AND SPONGIOTIC TISSUE REACTION  LABIA, RIGHT, BIOPSY:  MINIMAL TO MILD PSORIASIFORM AND SPONGIOTIC TISSUE  REACTION Assessment:  1.  Vulvar burning. 2.  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 3.  History of VIN 3  Plan:   1. Patient will apply lidocaine gel to the biopsy site for discomfort 2. Begin Temovate 0.05% applied to the irritate site BID for the first week, QD for the second week, every other day for the third week, then discontinue use.  3. Follow up as scheduled for physical  A total of 15 minutes were spent face-to-face with the patient during this encounter and over half of that time dealt with counseling and coordination of care.    Loni Muse PA-S Brayton Mars, MD   I have seen, interviewed, and examined the patient in conjunction with the [facility].A. student and affirm the diagnosis and management plan. Adriano Bischof A. Armondo Cech, MD, FACOG   Note: This dictation was prepared with Dragon dictation along with smaller phrase technology. Any transcriptional errors that result from this process are unintentional.'

## 2015-11-30 NOTE — Patient Instructions (Signed)
1.  Lidocaine ointment to be applied topically 4 times a day as tolerated/needed. 2.  In 3 days.  Began using Temovate ointment 0.05% topically twice a day for 1 week, then once a day for 1 week, then every other day for 1 week. 3.  Return as scheduled in several months.

## 2015-12-01 ENCOUNTER — Encounter: Payer: Self-pay | Admitting: *Deleted

## 2015-12-02 ENCOUNTER — Ambulatory Visit
Admission: RE | Admit: 2015-12-02 | Discharge: 2015-12-02 | Disposition: A | Discharge status: Home / Self Care | Payer: 59 | Source: Ambulatory Visit | Attending: Unknown Physician Specialty | Admitting: Unknown Physician Specialty

## 2015-12-02 ENCOUNTER — Encounter
Admission: RE | Disposition: A | Discharge status: Home / Self Care | Payer: Self-pay | Source: Ambulatory Visit | Attending: Unknown Physician Specialty

## 2015-12-02 ENCOUNTER — Encounter: Payer: Self-pay | Admitting: *Deleted

## 2015-12-02 ENCOUNTER — Ambulatory Visit: Payer: 59 | Admitting: Anesthesiology

## 2015-12-02 DIAGNOSIS — Z91041 Radiographic dye allergy status: Secondary | ICD-10-CM | POA: Insufficient documentation

## 2015-12-02 DIAGNOSIS — F17210 Nicotine dependence, cigarettes, uncomplicated: Secondary | ICD-10-CM | POA: Diagnosis not present

## 2015-12-02 DIAGNOSIS — R102 Pelvic and perineal pain: Secondary | ICD-10-CM | POA: Diagnosis not present

## 2015-12-02 DIAGNOSIS — R1012 Left upper quadrant pain: Secondary | ICD-10-CM | POA: Insufficient documentation

## 2015-12-02 DIAGNOSIS — R51 Headache: Secondary | ICD-10-CM | POA: Insufficient documentation

## 2015-12-02 DIAGNOSIS — Z8249 Family history of ischemic heart disease and other diseases of the circulatory system: Secondary | ICD-10-CM | POA: Insufficient documentation

## 2015-12-02 DIAGNOSIS — Z8349 Family history of other endocrine, nutritional and metabolic diseases: Secondary | ICD-10-CM | POA: Diagnosis not present

## 2015-12-02 DIAGNOSIS — Z882 Allergy status to sulfonamides status: Secondary | ICD-10-CM | POA: Insufficient documentation

## 2015-12-02 DIAGNOSIS — Z888 Allergy status to other drugs, medicaments and biological substances status: Secondary | ICD-10-CM | POA: Insufficient documentation

## 2015-12-02 DIAGNOSIS — K219 Gastro-esophageal reflux disease without esophagitis: Secondary | ICD-10-CM | POA: Insufficient documentation

## 2015-12-02 DIAGNOSIS — Z90710 Acquired absence of both cervix and uterus: Secondary | ICD-10-CM | POA: Insufficient documentation

## 2015-12-02 DIAGNOSIS — Z86010 Personal history of colonic polyps: Secondary | ICD-10-CM | POA: Insufficient documentation

## 2015-12-02 DIAGNOSIS — E039 Hypothyroidism, unspecified: Secondary | ICD-10-CM | POA: Diagnosis not present

## 2015-12-02 DIAGNOSIS — N946 Dysmenorrhea, unspecified: Secondary | ICD-10-CM | POA: Insufficient documentation

## 2015-12-02 DIAGNOSIS — R1013 Epigastric pain: Secondary | ICD-10-CM | POA: Insufficient documentation

## 2015-12-02 DIAGNOSIS — K2950 Unspecified chronic gastritis without bleeding: Secondary | ICD-10-CM | POA: Insufficient documentation

## 2015-12-02 DIAGNOSIS — F410 Panic disorder [episodic paroxysmal anxiety] without agoraphobia: Secondary | ICD-10-CM | POA: Diagnosis not present

## 2015-12-02 DIAGNOSIS — N920 Excessive and frequent menstruation with regular cycle: Secondary | ICD-10-CM | POA: Diagnosis not present

## 2015-12-02 DIAGNOSIS — Z79899 Other long term (current) drug therapy: Secondary | ICD-10-CM | POA: Diagnosis not present

## 2015-12-02 DIAGNOSIS — G8929 Other chronic pain: Secondary | ICD-10-CM | POA: Insufficient documentation

## 2015-12-02 HISTORY — DX: Diverticulosis of intestine, part unspecified, without perforation or abscess without bleeding: K57.90

## 2015-12-02 HISTORY — PX: ESOPHAGOGASTRODUODENOSCOPY (EGD) WITH PROPOFOL: SHX5813

## 2015-12-02 HISTORY — DX: Polyp of colon: K63.5

## 2015-12-02 SURGERY — ESOPHAGOGASTRODUODENOSCOPY (EGD) WITH PROPOFOL
Anesthesia: General

## 2015-12-02 MED ORDER — SODIUM CHLORIDE 0.9 % IV SOLN
INTRAVENOUS | Status: DC
Start: 2015-12-02 — End: 2015-12-02

## 2015-12-02 MED ORDER — PROPOFOL 500 MG/50ML IV EMUL
INTRAVENOUS | Status: DC | PRN
  Administered 2015-12-02: 150 ug/kg/min via INTRAVENOUS

## 2015-12-02 MED ORDER — IPRATROPIUM-ALBUTEROL 0.5-2.5 (3) MG/3ML IN SOLN
RESPIRATORY_TRACT | Status: AC
Start: 2015-12-02 — End: 2015-12-02
  Administered 2015-12-02: 3 mL
  Filled 2015-12-02: qty 3

## 2015-12-02 MED ORDER — GLYCOPYRROLATE 0.2 MG/ML IJ SOLN
INTRAMUSCULAR | Status: DC | PRN
  Administered 2015-12-02: 0.2 mg via INTRAVENOUS

## 2015-12-02 MED ORDER — PROPOFOL 10 MG/ML IV BOLUS
INTRAVENOUS | Status: DC | PRN
  Administered 2015-12-02: 50 mg via INTRAVENOUS

## 2015-12-02 MED ORDER — ONDANSETRON HCL 4 MG/2ML IJ SOLN
INTRAMUSCULAR | Status: DC | PRN
  Administered 2015-12-02: 4 mg via INTRAVENOUS

## 2015-12-02 MED ORDER — MIDAZOLAM HCL 5 MG/5ML IJ SOLN
INTRAMUSCULAR | Status: DC | PRN
  Administered 2015-12-02: 1 mg via INTRAVENOUS

## 2015-12-02 MED ORDER — LIDOCAINE HCL (PF) 2 % IJ SOLN
INTRAMUSCULAR | Status: DC | PRN
  Administered 2015-12-02: 60 mg

## 2015-12-02 MED ORDER — FENTANYL CITRATE (PF) 100 MCG/2ML IJ SOLN
INTRAMUSCULAR | Status: DC | PRN
  Administered 2015-12-02: 50 ug via INTRAVENOUS

## 2015-12-02 MED ORDER — SODIUM CHLORIDE 0.9 % IV SOLN
INTRAVENOUS | Status: DC
  Administered 2015-12-02: 15:00:00 via INTRAVENOUS

## 2015-12-02 NOTE — H&P (Signed)
Primary Care Physician:  Idelle Crouch, MD Primary Gastroenterologist:  Dr. Vira Agar  Pre-Procedure History & Physical: HPI:  Kayla Sellers is a 50 y.o. female is here for an endoscopy.   Past Medical History  Diagnosis Date  . Limb deformities, congenital   . Thyroid disease     hypothyroidism  . VIN III (vulvar intraepithelial neoplasia III)     s/p wle  . Chronic pelvic pain in female   . Fibroid   . Dysmenorrhea   . Endometriosis   . Panic disorder   . HA (headache)   . Left flank pain   . HSV-2 (herpes simplex virus 2) infection   . Tobacco user   . Acid reflux   . Vaginal dryness   . Menopausal state   . Colon polyps   . Diverticulosis     Past Surgical History  Procedure Laterality Date  . Wide local excision  2013    pos margin  . Vaginal hysterectomy    . Oophorectomy Right   . Hand surgery Right     bone graft  . Tubal ligation    . Cesarean section      Prior to Admission medications   Medication Sig Start Date End Date Taking? Authorizing Provider  ibuprofen (ADVIL,MOTRIN) 800 MG tablet Take 800 mg by mouth every 8 (eight) hours as needed.     Yes Historical Provider, MD  levothyroxine (SYNTHROID, LEVOTHROID) 137 MCG tablet Take 137 mcg by mouth daily.     Yes Historical Provider, MD  ondansetron (ZOFRAN) 4 MG tablet Take by mouth. 11/29/15 12/09/15 Yes Historical Provider, MD  promethazine (PHENERGAN) 25 MG tablet Take 25 mg by mouth every 6 (six) hours as needed for nausea or vomiting.   Yes Historical Provider, MD  RABEprazole (ACIPHEX) 20 MG tablet TAKE 1 TABLET (20 MG TOTAL) BY MOUTH 2 (TWO) TIMES DAILY. 11/16/15  Yes Historical Provider, MD  valACYclovir (VALTREX) 500 MG tablet Take 1 tablet (500 mg total) by mouth daily. 06/02/15  Yes Alanda Slim Defrancesco, MD  Vitamin D, Cholecalciferol, 1000 UNITS CAPS Take 1,000 mg by mouth.   Yes Historical Provider, MD  clobetasol ointment (TEMOVATE) [id number] % Apply 1 application topically 2 (two) times  daily. Apply to affected area Twice a day for 1 week, then once a day for 1 week, then every other day for 1 week. 12/03/15   Alanda Slim Defrancesco, MD  clotrimazole-betamethasone (LOTRISONE) cream Apply 1 application topically 2 (two) times daily. Reported on 12/02/2015    Historical Provider, MD  Lidocaine 0.5 % GEL Apply 0.5 g topically QID. 11/30/15   Alanda Slim Defrancesco, MD  ranitidine (ZANTAC) 300 MG tablet Take by mouth. Reported on 12/02/2015 11/29/15 11/28/16  Historical Provider, MD    Allergies as of 11/30/2015 - Review Complete 11/30/2015  Allergen Reaction Noted  . Flagyl [metronidazole] Anaphylaxis 05/26/2015  . Iodinated diagnostic agents Anaphylaxis 05/26/2015  . Sulfa drugs cross reactors  07/16/2011    Family History  Problem Relation Age of Onset  . Diabetes Paternal Aunt   . Heart disease Paternal Grandfather   . Cancer Neg Hx     Social History   Social History  . Marital Status: Divorced    Spouse Name: N/A  . Number of Children: N/A  . Years of Education: N/A   Occupational History  . Not on file.   Social History Main Topics  . Smoking status: Current Every Day Smoker -- 1.00 packs/day    Types:  Cigarettes  . Smokeless tobacco: Never Used  . Alcohol Use: Yes     Comment: socially  . Drug Use: No  . Sexual Activity: Yes    Birth Control/ Protection: None   Other Topics Concern  . Not on file   Social History Narrative    Review of Systems: See HPI, otherwise negative ROS  Physical Exam: BP 133/76 mmHg  Pulse 102  Temp(Src) 98.6 F (37 C) (Tympanic)  Resp 14  Ht 5\' 2"  (1.575 m)  Wt 58.06 kg (128 lb)  BMI 23.41 kg/m2  SpO2 98% General:   Alert,  pleasant and cooperative in NAD Head:  Normocephalic and atraumatic. Neck:  Supple; no masses or thyromegaly. Lungs:  Clear throughout to auscultation.    Heart:  Regular rate and rhythm. Abdomen:  Soft, nontender and nondistended. Normal bowel sounds, without guarding, and without rebound.    Neurologic:  Alert and  oriented x4;  grossly normal neurologically.  Impression/Plan: Kayla Sellers is here for an endoscopy to be performed for epigastric abdominal pain  Risks, benefits, limitations, and alternatives regarding  endoscopy have been reviewed with the patient.  Questions have been answered.  All parties agreeable.   Gaylyn Cheers, MD  12/02/2015, 3:05 PM

## 2015-12-02 NOTE — Anesthesia Postprocedure Evaluation (Signed)
Anesthesia Post Note  Patient: Kayla Sellers Patient  Procedure(s) Performed: Procedure(s) (LRB): ESOPHAGOGASTRODUODENOSCOPY (EGD) WITH PROPOFOL (N/A)  Patient location during evaluation: Endoscopy Anesthesia Type: General Level of consciousness: awake and alert Pain management: pain level controlled Vital Signs Assessment: post-procedure vital signs reviewed and stable Respiratory status: spontaneous breathing, nonlabored ventilation, respiratory function stable and patient connected to nasal cannula oxygen Cardiovascular status: blood pressure returned to baseline and stable Postop Assessment: no signs of nausea or vomiting Anesthetic complications: no    Last Vitals:  Filed Vitals:   12/02/15 1550 12/02/15 1600  BP: 126/84 131/76  Pulse: 88 89  Temp:    Resp: 15 14    Last Pain: There were no vitals filed for this visit.               Kayla Sellers

## 2015-12-02 NOTE — Transfer of Care (Signed)
Immediate Anesthesia Transfer of Care Note  Patient: Kayla Sellers  Procedure(s) Performed: Procedure(s): ESOPHAGOGASTRODUODENOSCOPY (EGD) WITH PROPOFOL (N/A)  Patient Location: PACU  Anesthesia Type:General  Level of Consciousness: sedated  Airway & Oxygen Therapy: Patient Spontanous Breathing and Patient connected to nasal cannula oxygen  Post-op Assessment: Report given to RN and Post -op Vital signs reviewed and stable  Post vital signs: Reviewed and stable  Last Vitals:  Filed Vitals:   12/02/15 1436  BP: 133/76  Pulse: 102  Temp: 37 C  Resp: 14    Complications: No apparent anesthesia complications

## 2015-12-02 NOTE — Op Note (Signed)
[facility]enter[facility] Patient Name: Kayla Sellers Procedure Date: 12/02/2015 3:13 PM MRN: [id number] Account #: [id number] Date of Birth: August 27, 1966 Admit Type: Outpatient Age: 50 Room: [facility] ENDO ROOM 1 Gender: Female Note Status: Finalized Procedure:         Upper GI endoscopy Indications:       Epigastric abdominal pain, Abdominal pain in the left                     upper quadrant Providers:         Manya Silvas, MD Referring MD:      Leonie Douglas. Doy Hutching, MD (Referring MD) Medicines:         Propofol per Anesthesia Complications:     No immediate complications. Procedure:         Pre-Anesthesia Assessment:                    - After reviewing the risks and benefits, the patient was                     deemed in satisfactory condition to undergo the procedure.                    After obtaining informed consent, the endoscope was passed                     under direct vision. Throughout the procedure, the                     patient's blood pressure, pulse, and oxygen saturations                     were monitored continuously. The Endoscope was introduced                     through the mouth, and advanced to the second part of                     duodenum. The upper GI endoscopy was accomplished without                     difficulty. The patient tolerated the procedure well. Findings:      The examined esophagus was normal.      Diffuse and patchy minimal inflammation characterized by erythema and       granularity was found in the gastric antrum. Biopsies were taken with a       cold forceps for histology. Biopsies were taken with a cold forceps for       Helicobacter pylori testing. bIopsies also done of body of stomach.      The examined duodenum was normal. Impression:        - Normal esophagus.                    - Gastritis. Biopsied.                    - Normal examined duodenum. Recommendation:    - The findings and recommendations were  discussed with the                     patient's family. Manya Silvas, MD 12/02/2015 3:28:47 PM This report has been signed electronically. Number of Addenda: 0 Note Initiated On: 12/02/2015 3:13 PM  [facility]

## 2015-12-02 NOTE — Anesthesia Preprocedure Evaluation (Signed)
Anesthesia Evaluation  Patient identified by MRN, date of birth, ID band Patient awake    Reviewed: Allergy & Precautions, H&P , NPO status , Patient's Chart, lab work & pertinent test results  History of Anesthesia Complications Negative for: history of anesthetic complications  Airway Mallampati: III  TM Distance: >3 FB Neck ROM: full    Dental  (+) Poor Dentition   Pulmonary neg shortness of breath, Current Smoker,    Pulmonary exam normal breath sounds clear to auscultation       Cardiovascular Exercise Tolerance: Good (-) angina(-) Past MI and (-) DOE Normal cardiovascular exam+ dysrhythmias  Rhythm:regular Rate:Normal     Neuro/Psych  Headaches, PSYCHIATRIC DISORDERS Anxiety    GI/Hepatic Neg liver ROS, GERD  Controlled,  Endo/Other  Hypothyroidism   Renal/GU negative Renal ROS  negative genitourinary   Musculoskeletal   Abdominal   Peds  Hematology negative hematology ROS (+)   Anesthesia Other Findings Past Medical History:   Limb deformities, congenital                                 Thyroid disease                                                Comment:hypothyroidism   VIN III (vulvar intraepithelial neoplasia III)                 Comment:s/p wle   Chronic pelvic pain in female                                Fibroid                                                      Dysmenorrhea                                                 Endometriosis                                                Panic disorder                                               HA (headache)                                                Left flank pain  HSV-2 (herpes simplex virus 2) infection                     Tobacco user                                                 Acid reflux                                                  Vaginal dryness                                               Menopausal state                                             Colon polyps                                                 Diverticulosis                                              Past Surgical History:   wide local excision                              2013           Comment:pos margin   VAGINAL HYSTERECTOMY                                          OOPHORECTOMY                                    Right              HAND SURGERY                                    Right                Comment:bone graft   TUBAL LIGATION                                                CESAREAN SECTION  BMI    Body Mass Index   23.40 kg/m 2      Reproductive/Obstetrics negative OB ROS                             Anesthesia Physical Anesthesia Plan  ASA: III  Anesthesia Plan: General   Post-op Pain Management:    Induction:   Airway Management Planned:   Additional Equipment:   Intra-op Plan:   Post-operative Plan:   Informed Consent: I have reviewed the patients History and Physical, chart, labs and discussed the procedure including the risks, benefits and alternatives for the proposed anesthesia with the patient or authorized representative who has indicated his/her understanding and acceptance.   Dental Advisory Given  Plan Discussed with: Anesthesiologist, CRNA and Surgeon  Anesthesia Plan Comments:         Anesthesia Quick Evaluation

## 2015-12-03 ENCOUNTER — Encounter: Payer: Self-pay | Admitting: Unknown Physician Specialty

## 2015-12-06 ENCOUNTER — Other Ambulatory Visit: Payer: Self-pay | Admitting: Nurse Practitioner

## 2015-12-06 DIAGNOSIS — R1901 Right upper quadrant abdominal swelling, mass and lump: Secondary | ICD-10-CM

## 2015-12-06 DIAGNOSIS — R10811 Right upper quadrant abdominal tenderness: Secondary | ICD-10-CM

## 2015-12-07 LAB — SURGICAL PATHOLOGY

## 2015-12-14 ENCOUNTER — Encounter
Admission: RE | Admit: 2015-12-14 | Discharge: 2015-12-14 | Disposition: A | Discharge status: Home / Self Care | Payer: 59 | Source: Ambulatory Visit | Attending: Nurse Practitioner | Admitting: Nurse Practitioner

## 2015-12-14 ENCOUNTER — Ambulatory Visit
Admission: RE | Admit: 2015-12-14 | Discharge: 2015-12-14 | Disposition: A | Discharge status: Home / Self Care | Payer: 59 | Source: Ambulatory Visit | Attending: Nurse Practitioner | Admitting: Nurse Practitioner

## 2015-12-14 DIAGNOSIS — R1901 Right upper quadrant abdominal swelling, mass and lump: Secondary | ICD-10-CM | POA: Insufficient documentation

## 2015-12-14 DIAGNOSIS — R10811 Right upper quadrant abdominal tenderness: Secondary | ICD-10-CM

## 2015-12-14 MED ORDER — SINCALIDE 5 MCG IJ SOLR
0.0200 ug/kg | Freq: Once | INTRAMUSCULAR | Status: AC
  Administered 2015-12-14: 1.2 ug via INTRAVENOUS

## 2015-12-14 MED ORDER — TECHNETIUM TC 99M MEBROFENIN IV KIT
5.1110 | PACK | Freq: Once | INTRAVENOUS | Status: AC | PRN
  Administered 2015-12-14: 5.111 via INTRAVENOUS

## 2016-01-05 ENCOUNTER — Ambulatory Visit: Payer: 59 | Admitting: Obstetrics and Gynecology

## 2016-02-07 ENCOUNTER — Ambulatory Visit
Admission: RE | Admit: 2016-02-07 | Discharge: 2016-02-07 | Disposition: A | Discharge status: Home / Self Care | Payer: 59 | Source: Ambulatory Visit | Attending: Obstetrics and Gynecology | Admitting: Obstetrics and Gynecology

## 2016-02-07 DIAGNOSIS — Z1231 Encounter for screening mammogram for malignant neoplasm of breast: Secondary | ICD-10-CM | POA: Insufficient documentation

## 2016-02-08 ENCOUNTER — Ambulatory Visit: Payer: 59 | Admitting: Obstetrics and Gynecology

## 2016-03-14 ENCOUNTER — Encounter: Payer: Self-pay | Admitting: Obstetrics and Gynecology

## 2016-03-14 ENCOUNTER — Ambulatory Visit (INDEPENDENT_AMBULATORY_CARE_PROVIDER_SITE_OTHER): Payer: 59 | Admitting: Obstetrics and Gynecology

## 2016-03-14 VITALS — BP 128/77 | HR 90 | Temp 98.6°F | Ht 62.0 in | Wt 124.3 lb

## 2016-03-14 DIAGNOSIS — N9489 Other specified conditions associated with female genital organs and menstrual cycle: Secondary | ICD-10-CM | POA: Diagnosis not present

## 2016-03-14 DIAGNOSIS — N760 Acute vaginitis: Secondary | ICD-10-CM

## 2016-03-14 DIAGNOSIS — L309 Dermatitis, unspecified: Secondary | ICD-10-CM

## 2016-03-14 DIAGNOSIS — A499 Bacterial infection, unspecified: Secondary | ICD-10-CM

## 2016-03-14 DIAGNOSIS — R102 Pelvic and perineal pain: Secondary | ICD-10-CM | POA: Diagnosis not present

## 2016-03-14 DIAGNOSIS — B9689 Other specified bacterial agents as the cause of diseases classified elsewhere: Secondary | ICD-10-CM | POA: Insufficient documentation

## 2016-03-14 DIAGNOSIS — L308 Other specified dermatitis: Secondary | ICD-10-CM

## 2016-03-14 LAB — POCT URINALYSIS DIPSTICK
BILIRUBIN UA: NEGATIVE
GLUCOSE UA: NEGATIVE
KETONES UA: NEGATIVE
LEUKOCYTES UA: NEGATIVE
Nitrite, UA: NEGATIVE
PH UA: 8.5
Protein, UA: NEGATIVE
Spec Grav, UA: <=1.005
Urobilinogen, UA: 0.2

## 2016-03-14 MED ORDER — CLINDAMYCIN HCL 300 MG PO CAPS: 300.0000 mg | ORAL_CAPSULE | Freq: Two times a day (BID) | ORAL | Status: DC

## 2016-03-14 MED ORDER — METRONIDAZOLE 0.75 % VA GEL: 1.0000 | Freq: Every day | VAGINAL | Status: DC

## 2016-03-14 NOTE — Progress Notes (Signed)
Chief complaint: 1. "Just not feeling well" 2. Vaginal discharge 3. Vaginal odor 4. Vulvar burning  Patient presents for evaluation. No recent antibiotic use. Patient is concerned about unusual vaginal odor that is associated with intercourse recently. She has slight vaginal discharge with minimal itching.   Past medical history, past surgical history, problem list, medications, and allergies are reviewed  OBJECTIVE: BP 128/77 mmHg  Pulse 90  Temp(Src) 98.6 F (37 C)  Ht 5\' 2"  (1.575 m)  Wt 124 lb 4.8 oz (56.382 kg)  BMI 22.73 kg/m2 Pleasant female in no acute distress. Slightly anxious (parenting concerns) Back: No CVA tenderness Abdomen: Soft, nontender without organomegaly Pelvic: External genitalia normal BUS-normal Vagina-moderate white discharge; no significant odor Cervix-surgically absent Uterus surgically absent Rectovaginal-normal external exam  PROCEDURE: Wet prep KOH-no yeast Normal saline-few clue cells; no white blood cells; no trichomoniasis  ASSESSMENT: 1. Possible bacterial vaginosis; metronidazole allergy-anaphylaxis 2. Psoriasiform dermatitis-vulva  PLAN: 1. Clindamycin 300 mg by mouth twice a day for 7 days 2. Return as needed if symptoms persist 3. Patient may use Temovate ointment topically for vulvar burning if symptoms persist (previous diagnosis of psoriasiform dermatitis on biopsy)  A total of 15 minutes were spent face-to-face with the patient during this encounter and over half of that time dealt with counseling and coordination of care.  Brayton Mars, MD  Note: This dictation was prepared with Dragon dictation along with smaller phrase technology. Any transcriptional errors that result from this process are unintentional.

## 2016-03-14 NOTE — Patient Instructions (Signed)
1. Urine culture is sent 2. Clindamycin 300 mg twice a day for 7 days is prescribed for bacterial vaginosis 3. May use Temovate ointment 0.05% topically to the vulva once or twice a day for the next 2 weeks for vulvar burning 4. Return as needed

## 2016-03-15 LAB — URINE CULTURE: Organism ID, Bacteria: NO GROWTH

## 2016-04-10 IMAGING — CT CT ABDOMEN AND PELVIS WITHOUT AND WITH CONTRAST
2 of 10 series · 11 of 46 positions shown, 17 images · IV contrast (isovue)
Comparison: Ultrasound 08/20/2014

CLINICAL DATA: Microhematuria.  No pain.

EXAM:
CT ABDOMEN AND PELVIS WITHOUT AND WITH CONTRAST
TECHNIQUE: Multidetector CT imaging of the abdomen and pelvis was performed
following the standard protocol before and following the bolus
administration of intravenous contrast.
Patient had a reaction to the IV contrast with trouble swallowing
and numbness in face and tongue. Heavy chest and pain attack
accompanied symptoms. Patient was transported to the emergency
department via EMS.
CONTRAST:  100 mL Isovue

[Series 7: delay · axial · delayed · 0.66mm/px · z∈[-1302,-932]mm · 9 of 94 slices shown, 15 images]
[im 10/94  soft-tissue]
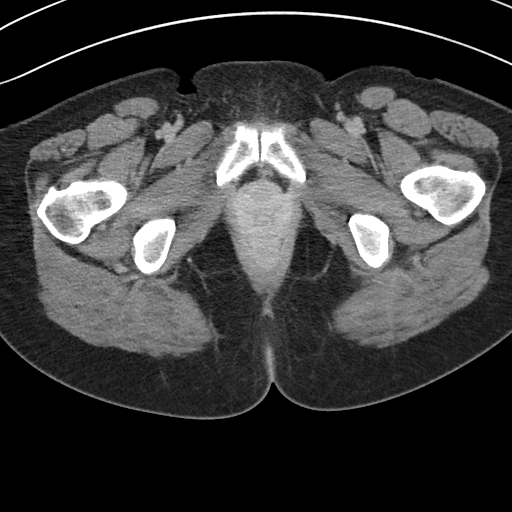
[im 10/94  bone]
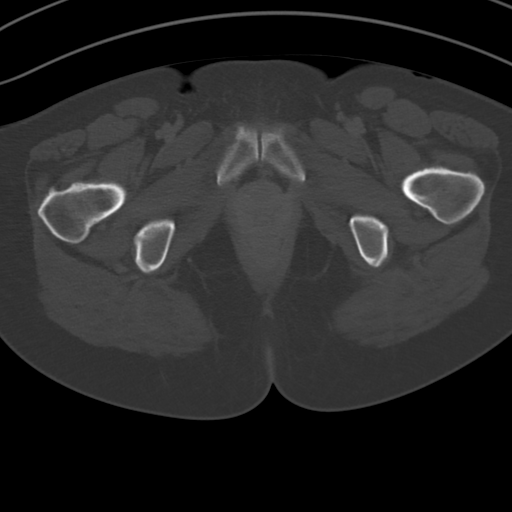
[im 19/94  soft-tissue]
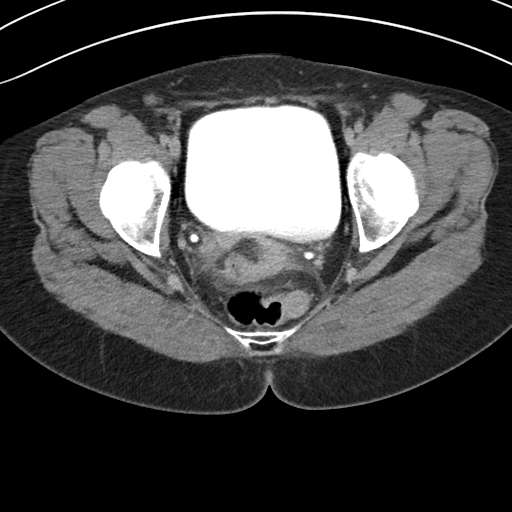
[im 28/94  soft-tissue]
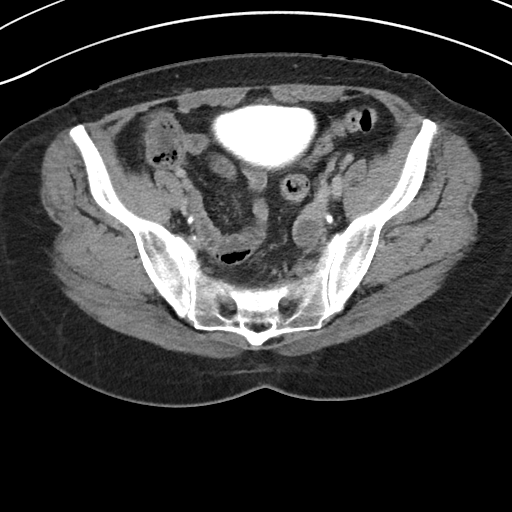
[im 38/94  soft-tissue]
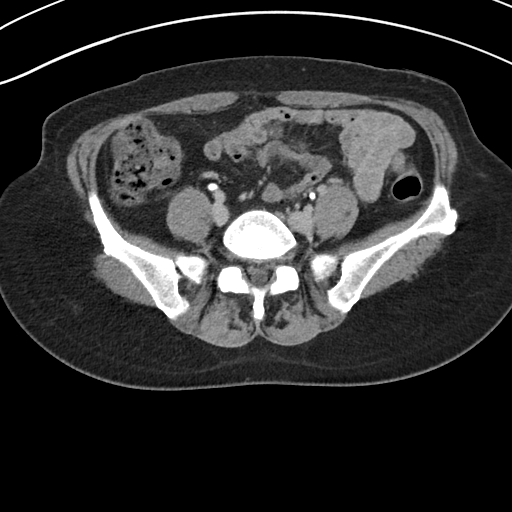
[im 47/94  soft-tissue]
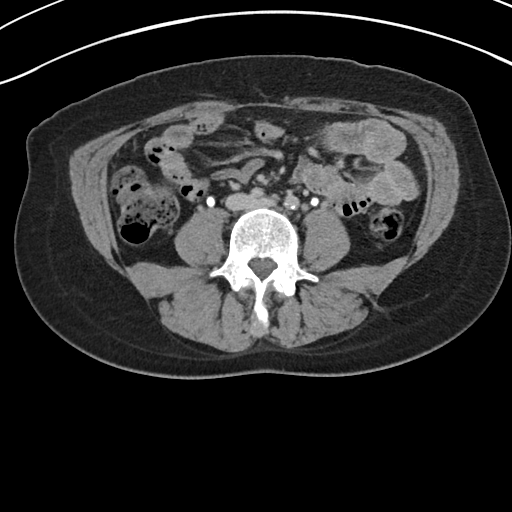
[im 56/94  soft-tissue]
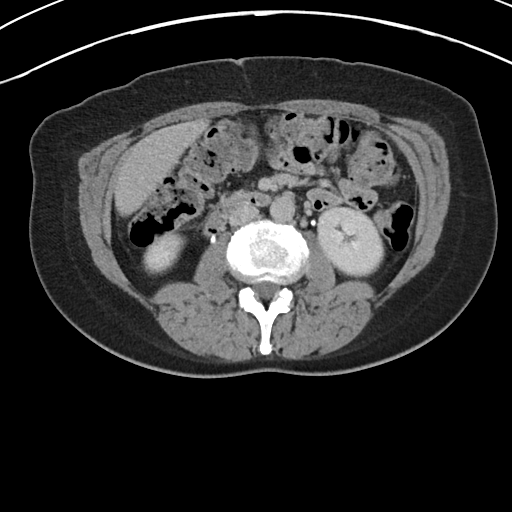
[im 56/94  lung]
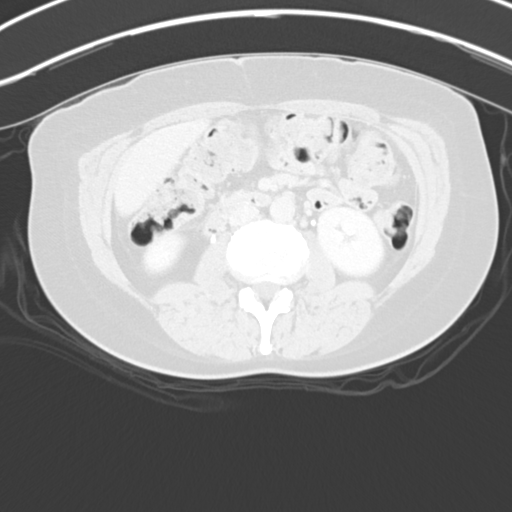
[im 66/94  soft-tissue]
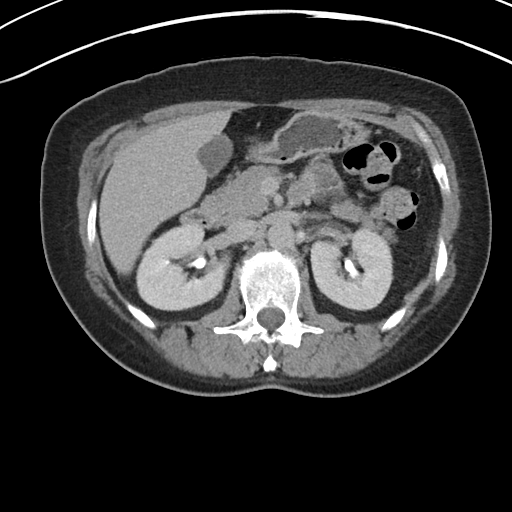
[im 66/94  lung]
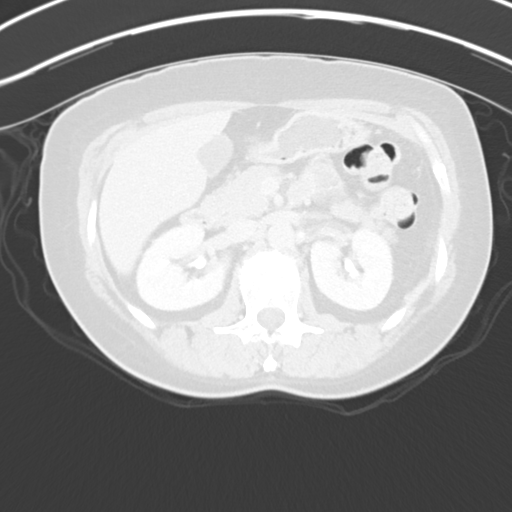
[im 75/94  soft-tissue]
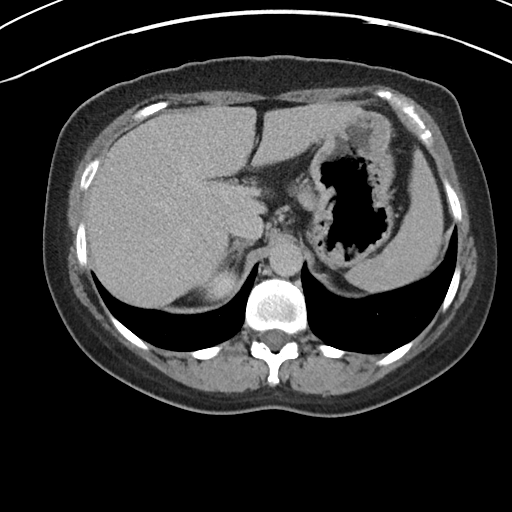
[im 75/94  lung]
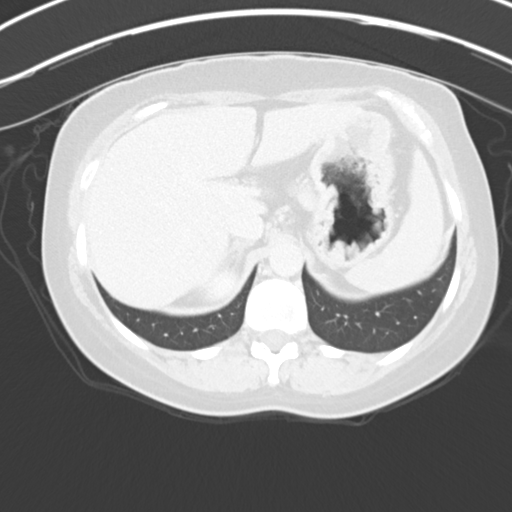
[im 84/94  soft-tissue]
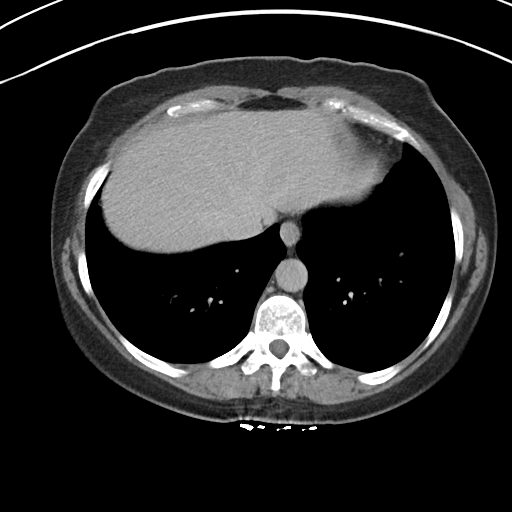
[im 84/94  lung]
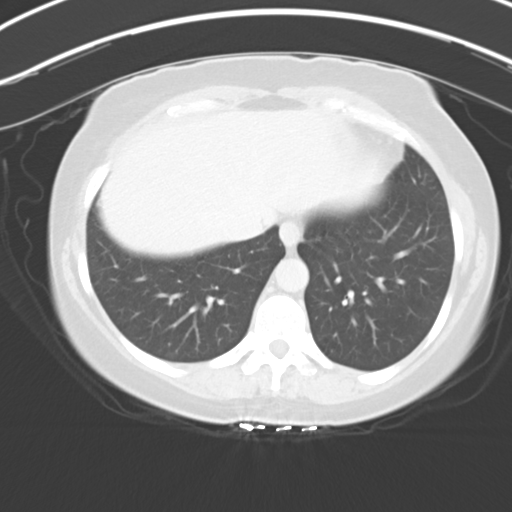
[im 84/94  bone]
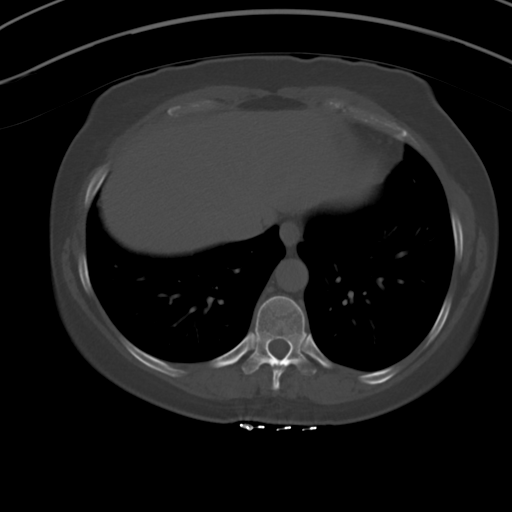

[Series 10: cor hematuria > 45 wo · coronal · 0.67mm/px · 2 of 128 slices shown]
[im 43/128  soft-tissue]
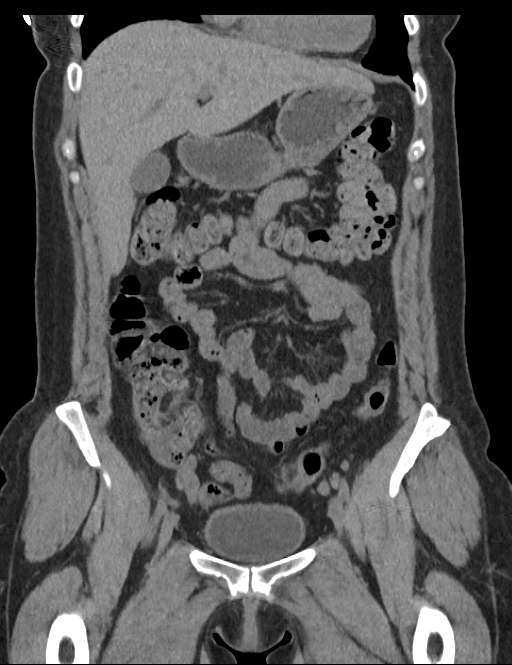
[im 85/128  soft-tissue]
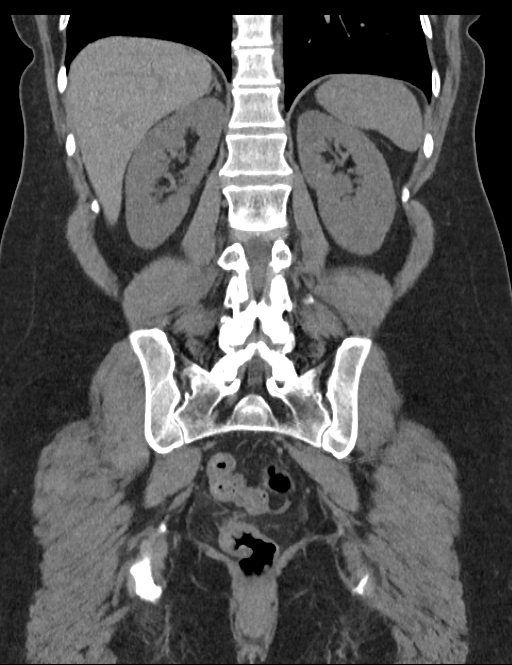

[11 of 46 positions shown; findings below may reference images not displayed]

FINDINGS: Lower chest:  Lung bases are clear.

Hepatobiliary: No focal hepatic lesion. No biliary duct dilatation.
The gallbladder is normal.

Pancreas: Pancreas is normal. No duct dilatation. No pancreatic
inflammation.

Spleen: Normal spleen

Adrenals/urinary tract: There is a small nodule of the left adrenal
gland measuring 7 mm on image 21 of series 4. This appears to
represent benign adenoma on the noncontrast series 2. There is no
nephrolithiasis or ureterolithiasis. No enhancing renal cortical
lesion. Delayed pyelogram phase imaging demonstrates no filling
defects within the collecting systems or ureters.

For no bladder calculi. No enhancing bladder lesion or filling
defect within the bladder.

Stomach/Bowel: New stomach, small bowel, appendix, cecum normal. The
colon view and rectosigmoid colon are normal.

Vascular/Lymphatic: Abdominal aorta is normal caliber. There is no
retroperitoneal or periportal lymphadenopathy. No pelvic
lymphadenopathy.

Reproductive: Post hysterectomy anatomy. There is a dominant within
the left ovary. Right ovary is not identified. No free fluid or
inflammation in the pelvis. The

Musculoskeletal: No aggressive osseous lesion.

Other: No peritoneal mesenteric nodularity.
IMPRESSION: 1. No explanation for hematuria. No nephrolithiasis,
ureterolithiasis, enhancing renal cortical lesion, or filling
defects within the collecting systems.
2. No bladder stones or filling defects in the bladder which does
not excluded a bladder lesion.
3. Small left adrenal nodule likely represents a benign adenoma.
4. By report patient had an adverse reaction to IV contrast dye.
Please see technique section

## 2016-06-18 NOTE — Progress Notes (Deleted)
ANNUAL PREVENTATIVE CARE GYN  ENCOUNTER NOTE  Subjective:       Kayla Sellers is a 50 y.o. No obstetric history on file. female here for a routine annual gynecologic exam.  Current complaints: 1.      Gynecologic History No LMP recorded. Patient has had a hysterectomy. Contraception: status post hysterectomy Last Pap: 04/2014 neg/neg. Results were: normal Last mammogram: 01/2016 birad 1. Results were: normal  Obstetric History OB History  No data available    Past Medical History:  Diagnosis Date  . Acid reflux   . Chronic pelvic pain in female   . Colon polyps   . Diverticulosis   . Dysmenorrhea   . Endometriosis   . Fibroid   . HA (headache)   . HSV-2 (herpes simplex virus 2) infection   . Left flank pain   . Limb deformities, congenital   . Menopausal state   . Panic disorder   . Thyroid disease    hypothyroidism  . Tobacco user   . Vaginal dryness   . VIN III (vulvar intraepithelial neoplasia III)    s/p wle    Past Surgical History:  Procedure Laterality Date  . CESAREAN SECTION    . ESOPHAGOGASTRODUODENOSCOPY (EGD) WITH PROPOFOL N/A 12/02/2015   Procedure: ESOPHAGOGASTRODUODENOSCOPY (EGD) WITH PROPOFOL;  Surgeon: Manya Silvas, MD;  Location: [facility] ENDOSCOPY;  Service: Endoscopy;  Laterality: N/A;  . HAND SURGERY Right    bone graft  . OOPHORECTOMY Right   . TUBAL LIGATION    . VAGINAL HYSTERECTOMY    . wide local excision  2013   pos margin    Current Outpatient Prescriptions on File Prior to Visit  Medication Sig Dispense Refill  . clindamycin (CLEOCIN) 300 MG capsule Take 1 capsule (300 mg total) by mouth 2 (two) times daily. For seven days 14 capsule 0  . ibuprofen (ADVIL,MOTRIN) 800 MG tablet Take 800 mg by mouth every 8 (eight) hours as needed.      Marland Kitchen levothyroxine (SYNTHROID, LEVOTHROID) 137 MCG tablet Take 137 mcg by mouth daily.      . metroNIDAZOLE (METROGEL) 0.75 % vaginal gel Place 1 Applicatorful vaginally at bedtime. Apply one  applicatorful to vagina at bedtime for 5 days 70 g 1  . RABEprazole (ACIPHEX) 20 MG tablet TAKE 1 TABLET (20 MG TOTAL) BY MOUTH 2 (TWO) TIMES DAILY.  3  . ranitidine (ZANTAC) 300 MG tablet Take by mouth. Reported on 12/02/2015    . sucralfate (CARAFATE) 1 g tablet Take 1 g by mouth 4 (four) times daily -  with meals and at bedtime.    . valACYclovir (VALTREX) 500 MG tablet Take 1 tablet (500 mg total) by mouth daily. 30 tablet 11  . Vitamin D, Cholecalciferol, 1000 UNITS CAPS Take 1,000 mg by mouth.     No current facility-administered medications on file prior to visit.     Allergies  Allergen Reactions  . Flagyl [Metronidazole] Anaphylaxis  . Iodinated Diagnostic Agents Anaphylaxis  . Sulfa Drugs Cross Reactors     hives    Social History   Social History  . Marital status: Divorced    Spouse name: N/A  . Number of children: N/A  . Years of education: N/A   Occupational History  . Not on file.   Social History Main Topics  . Smoking status: Current Every Day Smoker    Packs/day: 1.00    Types: Cigarettes  . Smokeless tobacco: Never Used  . Alcohol use Yes  Comment: socially  . Drug use: No  . Sexual activity: Yes    Birth control/ protection: None   Other Topics Concern  . Not on file   Social History Narrative  . No narrative on file    Family History  Problem Relation Age of Onset  . Diabetes Paternal Aunt   . Heart disease Paternal Grandfather   . Cancer Neg Hx     The following portions of the patient's history were reviewed and updated as appropriate: allergies, current medications, past family history, past medical history, past social history, past surgical history and problem list.  Review of Systems ROS Review of Systems - General ROS: negative for - chills, fatigue, fever, hot flashes, night sweats, weight gain or weight loss Psychological ROS: negative for - anxiety, decreased libido, depression, mood swings, physical abuse or sexual  abuse Ophthalmic ROS: negative for - blurry vision, eye pain or loss of vision ENT ROS: negative for - headaches, hearing change, visual changes or vocal changes Allergy and Immunology ROS: negative for - hives, itchy/watery eyes or seasonal allergies Hematological and Lymphatic ROS: negative for - bleeding problems, bruising, swollen lymph nodes or weight loss Endocrine ROS: negative for - galactorrhea, hair pattern changes, hot flashes, malaise/lethargy, mood swings, palpitations, polydipsia/polyuria, skin changes, temperature intolerance or unexpected weight changes Breast ROS: negative for - new or changing breast lumps or nipple discharge Respiratory ROS: negative for - cough or shortness of breath Cardiovascular ROS: negative for - chest pain, irregular heartbeat, palpitations or shortness of breath Gastrointestinal ROS: no abdominal pain, change in bowel habits, or black or bloody stools Genito-Urinary ROS: no dysuria, trouble voiding, or hematuria Musculoskeletal ROS: negative for - joint pain or joint stiffness Neurological ROS: negative for - bowel and bladder control changes Dermatological ROS: negative for rash and skin lesion changes   Objective:   There were no vitals taken for this visit. CONSTITUTIONAL: Well-developed, well-nourished female in no acute distress.  PSYCHIATRIC: Normal mood and affect. Normal behavior. Normal judgment and thought content. [place]: Alert and oriented to person, place, and time. Normal muscle tone coordination. No cranial nerve deficit noted. HENT:  Normocephalic, atraumatic, External right and left ear normal. Oropharynx is clear and moist EYES: Conjunctivae and EOM are normal. Pupils are equal, round, and reactive to light. No scleral icterus.  NECK: Normal range of motion, supple, no masses.  Normal thyroid.  SKIN: Skin is warm and dry. No rash noted. Not diaphoretic. No erythema. No pallor. CARDIOVASCULAR: Normal heart rate noted, regular  rhythm, no murmur. RESPIRATORY: Clear to auscultation bilaterally. Effort and breath sounds normal, no problems with respiration noted. BREASTS: Symmetric in size. No masses, skin changes, nipple drainage, or lymphadenopathy. ABDOMEN: Soft, normal bowel sounds, no distention noted.  No tenderness, rebound or guarding.  BLADDER: Normal PELVIC:  External Genitalia: Normal  BUS: Normal  Vagina: Normal  Cervix: Normal  Uterus: Normal  Adnexa: Normal  RV: {Blank multiple:19196::"External Exam NormaI","No Rectal Masses","Normal Sphincter tone"}  MUSCULOSKELETAL: Normal range of motion. No tenderness.  No cyanosis, clubbing, or edema.  2+ distal pulses. LYMPHATIC: No Axillary, Supraclavicular, or Inguinal Adenopathy.    Assessment:   Annual gynecologic examination 50 y.o. Contraception: status post hysterectomy Normal BMI Problem List Items Addressed This Visit    Status post hysterectomy   VIN III (vulvar intraepithelial neoplasia III)    Other Visit Diagnoses    Well woman exam with routine gynecological exam    -  Primary   Menopausal symptoms  Plan:  Pap: due 2018 Mammogram: utd Stool Guaiac Testing:  Not Indicated Labs: ? Routine preventative health maintenance measures emphasized: {Blank multiple:19196::"Exercise/Diet/Weight control","Tobacco Warnings","Alcohol/Substance use risks","Stress Management","Peer Pressure Issues","Safe Sex"} *** Return to [place], [place]

## 2016-06-20 ENCOUNTER — Other Ambulatory Visit: Payer: Self-pay | Admitting: Internal Medicine

## 2016-06-20 DIAGNOSIS — R51 Headache: Principal | ICD-10-CM

## 2016-06-20 DIAGNOSIS — G8929 Other chronic pain: Secondary | ICD-10-CM

## 2016-06-21 ENCOUNTER — Encounter: Payer: 59 | Admitting: Obstetrics and Gynecology

## 2016-06-22 ENCOUNTER — Other Ambulatory Visit: Payer: Self-pay | Admitting: Internal Medicine

## 2016-06-22 DIAGNOSIS — R519 Headache, unspecified: Secondary | ICD-10-CM

## 2016-06-22 DIAGNOSIS — R51 Headache: Principal | ICD-10-CM

## 2016-06-22 NOTE — Progress Notes (Signed)
ANNUAL PREVENTATIVE CARE GYN  ENCOUNTER NOTE  Subjective:       Kayla Sellers is a 50 y.o. [id number]. female here for a routine annual gynecologic exam.  Current complaints: 1.  Annual gynecologic exam. 2. Vasomotor symptoms, vaginal itching and dryness, low sex drive. S/p RSO. No new partners. No vaginal discharge.  3. RLQ pain that hurts to press on and during sex. Does not require pain management or prevent her from daily activities. S/p RSO.  Has no urinary or bowel complaints at this time.     Gynecologic History No LMP recorded. Patient has had a hysterectomy. Status post TVH Contraception: status post hysterectomy  Status post laparoscopic RSO for chronic pelvic pain/endometriosis/pelvic adhesive disease Last Pap: 04/2014 neg/neg. Results were: normal Last mammogram: 01/2016 birad 1. 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 Acid reflux   . Chronic pelvic pain in female   . Colon polyps   . Diverticulosis   . Dysmenorrhea   . Endometriosis   . Fibroid   . HA (headache)   . HSV-2 (herpes simplex virus 2) infection   . Left flank pain   . Limb deformities, congenital   . Menopausal state   . Panic disorder   . Thyroid disease    hypothyroidism  . Tobacco user   . Vaginal dryness   . VIN III (vulvar intraepithelial neoplasia III)    s/p wle    Past Surgical History:  Procedure Laterality Date  . CESAREAN SECTION    . ESOPHAGOGASTRODUODENOSCOPY (EGD) WITH PROPOFOL N/A 12/02/2015   Procedure: ESOPHAGOGASTRODUODENOSCOPY (EGD) WITH PROPOFOL;  Surgeon: Manya Silvas, MD;  Location: [facility] ENDOSCOPY;  Service: Endoscopy;  Laterality: N/A;  . HAND SURGERY Right    bone graft  . OOPHORECTOMY Right   . TUBAL LIGATION    . VAGINAL HYSTERECTOMY    . wide local excision  2013   pos margin    Current Outpatient Prescriptions  on File Prior to Visit  Medication Sig Dispense Refill  . clindamycin (CLEOCIN) 300 MG capsule Take 1 capsule (300 mg total) by mouth 2 (two) times daily. For seven days 14 capsule 0  . ibuprofen (ADVIL,MOTRIN) 800 MG tablet Take 800 mg by mouth every 8 (eight) hours as needed.      Marland Kitchen levothyroxine (SYNTHROID, LEVOTHROID) 137 MCG tablet Take 137 mcg by mouth daily.      . metroNIDAZOLE (METROGEL) 0.75 % vaginal gel Place 1 Applicatorful vaginally at bedtime. Apply one applicatorful to vagina at bedtime for 5 days 70 g 1  . RABEprazole (ACIPHEX) 20 MG tablet TAKE 1 TABLET (20 MG TOTAL) BY MOUTH 2 (TWO) TIMES DAILY.  3  . ranitidine (ZANTAC) 300 MG tablet Take by mouth. Reported on 12/02/2015    . sucralfate (CARAFATE) 1 g tablet Take 1 g by mouth 4 (four) times daily -  with meals and at bedtime.    . valACYclovir (VALTREX) 500 MG tablet Take 1 tablet (500 mg total) by mouth daily. 30 tablet 11  . Vitamin D, Cholecalciferol, 1000 UNITS CAPS Take 1,000 mg by mouth.     No current facility-administered medications on file prior to visit.     Allergies  Allergen Reactions  . Flagyl [Metronidazole] Anaphylaxis  . Iodinated Diagnostic Agents Anaphylaxis  . Sulfa Drugs Cross Reactors     hives  Social History   Social History  . Marital status: Divorced    Spouse name: N/A  . Number of children: N/A  . Years of education: N/A   Occupational History  . Not on file.   Social History Main Topics  . Smoking status: Current Every Day Smoker    Packs/day: 1.00    Types: Cigarettes  . Smokeless tobacco: Never Used  . Alcohol use Yes     Comment: socially  . Drug use: No  . Sexual activity: Yes    Birth control/ protection: None   Other Topics Concern  . Not on file   Social History Narrative  . No narrative on file    Family History  Problem Relation Age of Onset  . Diabetes Paternal Aunt   . Heart disease Paternal Grandfather   . Cancer Neg Hx     The following portions  of the patient's history were reviewed and updated as appropriate: allergies, current medications, past family history, past medical history, past social history, past surgical history and problem list.  Review of Systems ROS Review of Systems - General ROS: negative for - chills, fatigue, fever, hot flashes, night sweats, weight gain or weight loss Psychological ROS: negative for - anxiety, decreased libido, depression, mood swings, physical abuse or sexual abuse Ophthalmic ROS: negative for - blurry vision, eye pain or loss of vision ENT ROS: negative for - headaches, hearing change, visual changes or vocal changes Allergy and Immunology ROS: negative for - hives, itchy/watery eyes or seasonal allergies Hematological and Lymphatic ROS: negative for - bleeding problems, bruising, swollen lymph nodes or weight loss Endocrine ROS: negative for - galactorrhea, hair pattern changes, hot flashes, malaise/lethargy, mood swings, palpitations, polydipsia/polyuria, skin changes, temperature intolerance or unexpected weight changes Breast ROS: negative for - new or changing breast lumps or nipple discharge Respiratory ROS: negative for - cough or shortness of breath Cardiovascular ROS: negative for - chest pain, irregular heartbeat, palpitations or shortness of breath Gastrointestinal ROS: no abdominal pain, change in bowel habits, or black or bloody stools Genito-Urinary ROS: no dysuria, trouble voiding, or hematuria Musculoskeletal ROS: negative for - joint pain or joint stiffness Neurological ROS: negative for - bowel and bladder control changes Dermatological ROS: negative for rash and skin lesion changes   Objective:   BP 113/71   Pulse 100   Ht 5\' 4"  (1.626 m)   Wt 124 lb 11.2 oz (56.6 kg)   BMI 21.40 kg/m  CONSTITUTIONAL: Well-developed, well-nourished female in no acute distress.  PSYCHIATRIC: Normal mood and affect. Normal behavior. Normal judgment and thought content. [place]: Alert  and oriented to person, place, and time. Normal muscle tone coordination. No cranial nerve deficit noted. HENT:  Normocephalic, atraumatic EYES: Conjunctivae and EOM are normal. No scleral icterus.  NECK: Normal range of motion, supple, no masses.  Normal thyroid.  SKIN: Skin is warm and dry. No rash noted. Not diaphoretic. No erythema. No pallor. CARDIOVASCULAR: Normal heart rate noted, regular rhythm, no murmur. RESPIRATORY: Clear to auscultation bilaterally. Effort and breath sounds normal, no problems with respiration noted. BREASTS: Symmetric in size. No masses, skin changes, nipple drainage, or lymphadenopathy. ABDOMEN: Soft, normal bowel sounds, no distention noted.  No tenderness, rebound or guarding.  BLADDER: Normal PELVIC:                       External Genitalia: Normal  BUS: Normal                       Vagina: Normal; vaginal cuff intact; mild tenderness in right lower quadrant with a sense of fullness without discrete mass (probable bowel); no peritoneal signs                       Cervix: surgically absent                       Uterus: surgically absent                       Adnexa: mild right lower quadrant tenderness as noted above; left lower quadrant, nonpalpable, nontender                       RV: External Exam NormaI, No Rectal Masses and Normal Sphincter tone  MUSCULOSKELETAL: Normal range of motion. No tenderness.  No cyanosis, clubbing, or edema.  2+ distal pulses. LYMPHATIC: No Axillary, Supraclavicular, or Inguinal Adenopathy.  Procedures: KOH/ Saline wet mount: Normal epithelial cells no evidence of clue cells or hyphae.   Assessment:   Annual gynecologic examination 50 y.o. Contraception: status post hysterectomy status post TVH Status post RSO Normal BMI Problem List Items Addressed This Visit    None    Visit Diagnoses    Encounter for screening mammogram for breast cancer    -  Primary   Well woman exam with routine  gynecological exam       Anxiety       Recurrent UTI         RLQ pain Likely adhesions and bowel fullness causing discomfort. Pain does not require pain management or prevent her daily activities. Pain appears to be chronic.  Plan:  Pap: Not needed Mammogram: utd Stool Guaiac Testing:  Not Indicated Labs: thru pcp Routine preventative health maintenance measures emphasized: Exercise/Diet/Weight control, Tobacco Warnings, Alcohol/Substance use risks, Stress Management and Safe Sex Ordered POC UA and Urine culture. Ordered FSH. Return to [place], [place]  Corene Cornea Whitaker PA-S Brayton Mars, MD   I have seen, interviewed, and examined the patient in conjunction with the [facility].A. student and affirm the diagnosis and management plan. Brandyce Dimario A. Luisfernando Brightwell, MD, FACOG  Note: This dictation was prepared with Dragon dictation along with smaller phrase technology. Any transcriptional errors that result from this process are unintentional.

## 2016-06-28 ENCOUNTER — Ambulatory Visit (INDEPENDENT_AMBULATORY_CARE_PROVIDER_SITE_OTHER): Payer: 59 | Admitting: Obstetrics and Gynecology

## 2016-06-28 ENCOUNTER — Encounter: Payer: Self-pay | Admitting: Obstetrics and Gynecology

## 2016-06-28 VITALS — BP 113/71 | HR 100 | Ht 64.0 in | Wt 124.7 lb

## 2016-06-28 DIAGNOSIS — F419 Anxiety disorder, unspecified: Secondary | ICD-10-CM | POA: Diagnosis not present

## 2016-06-28 DIAGNOSIS — Z01419 Encounter for gynecological examination (general) (routine) without abnormal findings: Secondary | ICD-10-CM

## 2016-06-28 DIAGNOSIS — N390 Urinary tract infection, site not specified: Secondary | ICD-10-CM | POA: Diagnosis not present

## 2016-06-28 DIAGNOSIS — Z1231 Encounter for screening mammogram for malignant neoplasm of breast: Secondary | ICD-10-CM

## 2016-06-28 DIAGNOSIS — N951 Menopausal and female climacteric states: Secondary | ICD-10-CM

## 2016-06-28 LAB — POCT URINALYSIS DIPSTICK
Bilirubin, UA: NEGATIVE
GLUCOSE UA: NEGATIVE
KETONES UA: NEGATIVE
Leukocytes, UA: NEGATIVE
Nitrite, UA: NEGATIVE
Protein, UA: NEGATIVE
SPEC GRAV UA: 1.01
Urobilinogen, UA: NEGATIVE
pH, UA: 7

## 2016-06-28 MED ORDER — VALACYCLOVIR HCL 500 MG PO TABS: 500.0000 mg | ORAL_TABLET | Freq: Every day | ORAL | 6 refills | Status: DC

## 2016-06-28 NOTE — Patient Instructions (Addendum)
1. Pap smear not done 2. Mammogram already completed 3. Screening labs are through primary care 4. Continue with healthy eating and exercise 5. Urinalysis and urine culture obtained 6. FSH is obtained to rule out menopause and etiology of vasomotor symptoms 7. Return in 1 year or as needed

## 2016-06-29 ENCOUNTER — Ambulatory Visit
Admission: RE | Admit: 2016-06-29 | Discharge: 2016-06-29 | Disposition: A | Discharge status: Home / Self Care | Payer: 59 | Source: Ambulatory Visit | Attending: Internal Medicine | Admitting: Internal Medicine

## 2016-06-29 DIAGNOSIS — R51 Headache: Secondary | ICD-10-CM | POA: Diagnosis present

## 2016-06-29 DIAGNOSIS — R519 Headache, unspecified: Secondary | ICD-10-CM

## 2016-06-29 LAB — FOLLICLE STIMULATING HORMONE: FSH: 23.8 m[IU]/mL

## 2016-06-29 LAB — URINE CULTURE: Organism ID, Bacteria: NO GROWTH

## 2016-07-04 ENCOUNTER — Ambulatory Visit: Payer: 59

## 2016-07-05 ENCOUNTER — Telehealth: Payer: Self-pay | Admitting: Obstetrics and Gynecology

## 2016-07-05 NOTE — Telephone Encounter (Signed)
PT CALLED AN DR DE TOLD HER HE WAS GOING TO SEND IN A RX FOR SOMETHING ABOUT ESTROGEN, AND WHEN SHE CALLED HER PHARMACY THE ONLY THING THAT WAS THERE WAS THE VALTREX, SO SHE WAS WANTING TO KNOW IF HE WAS GOING TO CALL IN THE RX FOR THE ESTROGEN OR NOT. PT WOULD LIKE A CALL BACK.

## 2016-07-06 NOTE — Telephone Encounter (Signed)
Pt states she was to get a rx for estrogen at [facility] (9/7). Note is not clear. What would you like me to send in. Thanks.

## 2016-07-10 MED ORDER — ESTRADIOL 1 MG PO TABS: 1.0000 mg | ORAL_TABLET | Freq: Every day | ORAL | 1 refills | Status: DC

## 2016-07-10 NOTE — Telephone Encounter (Signed)
Per mad ok to send in estradiol 1mg . Pt aware.

## 2016-07-10 NOTE — Addendum Note (Signed)
Addended by: Elouise Munroe on: 07/10/2016 12:10 PM   Modules accepted: Orders

## 2016-11-14 DIAGNOSIS — M545 Low back pain: Secondary | ICD-10-CM | POA: Diagnosis not present

## 2016-11-14 DIAGNOSIS — Z0000 Encounter for general adult medical examination without abnormal findings: Secondary | ICD-10-CM | POA: Diagnosis not present

## 2016-11-14 DIAGNOSIS — E782 Mixed hyperlipidemia: Secondary | ICD-10-CM | POA: Diagnosis not present

## 2016-11-14 DIAGNOSIS — M25551 Pain in right hip: Secondary | ICD-10-CM | POA: Diagnosis not present

## 2016-11-14 DIAGNOSIS — E079 Disorder of thyroid, unspecified: Secondary | ICD-10-CM | POA: Diagnosis not present

## 2016-11-14 DIAGNOSIS — M25511 Pain in right shoulder: Secondary | ICD-10-CM | POA: Diagnosis not present

## 2016-11-16 DIAGNOSIS — Z79899 Other long term (current) drug therapy: Secondary | ICD-10-CM | POA: Diagnosis not present

## 2016-11-16 DIAGNOSIS — Z131 Encounter for screening for diabetes mellitus: Secondary | ICD-10-CM | POA: Diagnosis not present

## 2016-11-16 DIAGNOSIS — E782 Mixed hyperlipidemia: Secondary | ICD-10-CM | POA: Diagnosis not present

## 2016-11-28 DIAGNOSIS — M7581 Other shoulder lesions, right shoulder: Secondary | ICD-10-CM | POA: Diagnosis not present

## 2016-11-28 DIAGNOSIS — M7551 Bursitis of right shoulder: Secondary | ICD-10-CM | POA: Diagnosis not present

## 2016-12-03 ENCOUNTER — Other Ambulatory Visit: Payer: Self-pay | Admitting: Surgery

## 2016-12-03 DIAGNOSIS — M7581 Other shoulder lesions, right shoulder: Secondary | ICD-10-CM

## 2016-12-03 DIAGNOSIS — M7551 Bursitis of right shoulder: Secondary | ICD-10-CM

## 2016-12-08 DIAGNOSIS — J209 Acute bronchitis, unspecified: Secondary | ICD-10-CM | POA: Diagnosis not present

## 2016-12-10 DIAGNOSIS — J069 Acute upper respiratory infection, unspecified: Secondary | ICD-10-CM | POA: Diagnosis not present

## 2016-12-12 ENCOUNTER — Ambulatory Visit: Payer: 59

## 2016-12-19 DIAGNOSIS — G8929 Other chronic pain: Secondary | ICD-10-CM | POA: Diagnosis not present

## 2016-12-19 DIAGNOSIS — M25511 Pain in right shoulder: Secondary | ICD-10-CM | POA: Diagnosis not present

## 2016-12-26 DIAGNOSIS — M25511 Pain in right shoulder: Secondary | ICD-10-CM | POA: Diagnosis not present

## 2016-12-26 DIAGNOSIS — G8929 Other chronic pain: Secondary | ICD-10-CM | POA: Diagnosis not present

## 2017-01-07 DIAGNOSIS — M7551 Bursitis of right shoulder: Secondary | ICD-10-CM | POA: Diagnosis not present

## 2017-01-07 DIAGNOSIS — M7581 Other shoulder lesions, right shoulder: Secondary | ICD-10-CM | POA: Diagnosis not present

## 2017-01-10 ENCOUNTER — Ambulatory Visit
Admission: RE | Admit: 2017-01-10 | Discharge: 2017-01-10 | Disposition: A | Discharge status: Home / Self Care | Payer: 59 | Source: Ambulatory Visit | Attending: Surgery | Admitting: Surgery

## 2017-01-10 DIAGNOSIS — M7551 Bursitis of right shoulder: Secondary | ICD-10-CM | POA: Insufficient documentation

## 2017-01-10 DIAGNOSIS — M7581 Other shoulder lesions, right shoulder: Secondary | ICD-10-CM | POA: Diagnosis not present

## 2017-01-10 DIAGNOSIS — M659 Synovitis and tenosynovitis, unspecified: Secondary | ICD-10-CM | POA: Insufficient documentation

## 2017-01-10 DIAGNOSIS — M25511 Pain in right shoulder: Secondary | ICD-10-CM | POA: Diagnosis not present

## 2017-01-25 DIAGNOSIS — Z86010 Personal history of colonic polyps: Secondary | ICD-10-CM | POA: Diagnosis not present

## 2017-01-25 DIAGNOSIS — R21 Rash and other nonspecific skin eruption: Secondary | ICD-10-CM | POA: Diagnosis not present

## 2017-01-25 DIAGNOSIS — K219 Gastro-esophageal reflux disease without esophagitis: Secondary | ICD-10-CM | POA: Diagnosis not present

## 2017-02-08 DIAGNOSIS — M7551 Bursitis of right shoulder: Secondary | ICD-10-CM | POA: Diagnosis not present

## 2017-02-08 DIAGNOSIS — M542 Cervicalgia: Secondary | ICD-10-CM | POA: Diagnosis not present

## 2017-02-08 DIAGNOSIS — M7581 Other shoulder lesions, right shoulder: Secondary | ICD-10-CM | POA: Diagnosis not present

## 2017-02-18 DIAGNOSIS — L508 Other urticaria: Secondary | ICD-10-CM | POA: Diagnosis not present

## 2017-02-19 DIAGNOSIS — Z86010 Personal history of colonic polyps: Secondary | ICD-10-CM | POA: Diagnosis not present

## 2017-02-19 DIAGNOSIS — K210 Gastro-esophageal reflux disease with esophagitis: Secondary | ICD-10-CM | POA: Diagnosis not present

## 2017-02-19 DIAGNOSIS — K219 Gastro-esophageal reflux disease without esophagitis: Secondary | ICD-10-CM | POA: Diagnosis not present

## 2017-02-19 DIAGNOSIS — K319 Disease of stomach and duodenum, unspecified: Secondary | ICD-10-CM | POA: Diagnosis not present

## 2017-02-19 DIAGNOSIS — K2990 Gastroduodenitis, unspecified, without bleeding: Secondary | ICD-10-CM | POA: Diagnosis not present

## 2017-02-19 DIAGNOSIS — K2960 Other gastritis without bleeding: Secondary | ICD-10-CM | POA: Diagnosis not present

## 2017-02-19 DIAGNOSIS — K3189 Other diseases of stomach and duodenum: Secondary | ICD-10-CM | POA: Diagnosis not present

## 2017-02-19 DIAGNOSIS — K640 First degree hemorrhoids: Secondary | ICD-10-CM | POA: Diagnosis not present

## 2017-02-19 DIAGNOSIS — D125 Benign neoplasm of sigmoid colon: Secondary | ICD-10-CM | POA: Diagnosis not present

## 2017-03-12 DIAGNOSIS — J3089 Other allergic rhinitis: Secondary | ICD-10-CM | POA: Diagnosis not present

## 2017-03-12 DIAGNOSIS — L501 Idiopathic urticaria: Secondary | ICD-10-CM | POA: Diagnosis not present

## 2017-03-13 DIAGNOSIS — E079 Disorder of thyroid, unspecified: Secondary | ICD-10-CM | POA: Diagnosis not present

## 2017-03-13 DIAGNOSIS — L508 Other urticaria: Secondary | ICD-10-CM | POA: Diagnosis not present

## 2017-05-11 DIAGNOSIS — B9689 Other specified bacterial agents as the cause of diseases classified elsewhere: Secondary | ICD-10-CM | POA: Diagnosis not present

## 2017-05-11 DIAGNOSIS — R209 Unspecified disturbances of skin sensation: Secondary | ICD-10-CM | POA: Diagnosis not present

## 2017-05-11 DIAGNOSIS — J019 Acute sinusitis, unspecified: Secondary | ICD-10-CM | POA: Diagnosis not present

## 2017-05-14 DIAGNOSIS — E7800 Pure hypercholesterolemia, unspecified: Secondary | ICD-10-CM | POA: Diagnosis not present

## 2017-05-14 DIAGNOSIS — E079 Disorder of thyroid, unspecified: Secondary | ICD-10-CM | POA: Diagnosis not present

## 2017-05-17 ENCOUNTER — Telehealth: Payer: Self-pay | Admitting: Obstetrics and Gynecology

## 2017-05-17 ENCOUNTER — Ambulatory Visit (INDEPENDENT_AMBULATORY_CARE_PROVIDER_SITE_OTHER): Payer: 59 | Admitting: Certified Nurse Midwife

## 2017-05-17 VITALS — BP 113/71 | HR 74 | Ht 62.0 in | Wt 140.2 lb

## 2017-05-17 DIAGNOSIS — R300 Dysuria: Secondary | ICD-10-CM | POA: Diagnosis not present

## 2017-05-17 DIAGNOSIS — N898 Other specified noninflammatory disorders of vagina: Secondary | ICD-10-CM | POA: Diagnosis not present

## 2017-05-17 LAB — POCT URINALYSIS DIPSTICK
BILIRUBIN UA: NEGATIVE
Glucose, UA: NEGATIVE
Ketones, UA: NEGATIVE
NITRITE UA: NEGATIVE
PH UA: 8 (ref 5.0–8.0)
Protein, UA: NEGATIVE
SPEC GRAV UA: 1.01 (ref 1.010–1.025)
UROBILINOGEN UA: 0.2 U/dL

## 2017-05-17 MED ORDER — FLUCONAZOLE 150 MG PO TABS: 150.0000 mg | ORAL_TABLET | Freq: Every day | ORAL | 1 refills | Status: AC

## 2017-05-17 MED ORDER — CLINDAMYCIN HCL 300 MG PO CAPS: 300.0000 mg | ORAL_CAPSULE | Freq: Two times a day (BID) | ORAL | 0 refills | Status: AC

## 2017-05-17 NOTE — Patient Instructions (Signed)

## 2017-05-17 NOTE — Progress Notes (Signed)
GYN ENCOUNTER NOTE  Subjective:       Kayla Sellers is a 51 y.o. G28P1102 female is here for gynecologic evaluation of the following issues:  1. Vaginal burning, swelling and irritation. Pt states that she started steroids and antibiotics for bels palsy/shingles several days ago and has since completed that course of medication but has started to have these vaginal symptoms last night.  She is currently sexually active with husband. They do not use condoms . She would like nuswab to R/O STD.    Gynecologic History No LMP recorded. Patient has had a hysterectomy. Contraception: none  Obstetric History OB History  Gravida Para Term Preterm AB Living  2 2 1 1   2   SAB TAB Ectopic Multiple Live Births          2    # Outcome Date GA Lbr Len/2nd Weight Sex Delivery Anes PTL Lv  2 Term 1999   6 lb (2.722 kg) M Vag-Spont   LIV  1 Preterm 1991   1 lb 9.6 oz (0.726 kg) F CS-LTranv   LIV      Past Medical History:  Diagnosis Date  . Acid reflux   . Chronic pelvic pain in female   . Colon polyps   . Diverticulosis   . Dysmenorrhea   . Endometriosis   . Fibroid   . HA (headache)   . HSV-2 (herpes simplex virus 2) infection   . Left flank pain   . Limb deformities, congenital   . Menopausal state   . Panic disorder   . Thyroid disease    hypothyroidism  . Tobacco user   . Vaginal dryness   . VIN III (vulvar intraepithelial neoplasia III)    s/p wle    Past Surgical History:  Procedure Laterality Date  . CESAREAN SECTION    . ESOPHAGOGASTRODUODENOSCOPY (EGD) WITH PROPOFOL N/A 12/02/2015   Procedure: ESOPHAGOGASTRODUODENOSCOPY (EGD) WITH PROPOFOL;  Surgeon: Manya Silvas, MD;  Location: [facility] ENDOSCOPY;  Service: Endoscopy;  Laterality: N/A;  . HAND SURGERY Right    bone graft  . OOPHORECTOMY Right   . TUBAL LIGATION    . VAGINAL HYSTERECTOMY    . wide local excision  2013   pos margin    Current Outpatient Prescriptions on File Prior to Visit  Medication Sig  Dispense Refill  . ibuprofen (ADVIL,MOTRIN) 800 MG tablet Take 800 mg by mouth every 8 (eight) hours as needed.      . RABEprazole (ACIPHEX) 20 MG tablet TAKE 1 TABLET (20 MG TOTAL) BY MOUTH 2 (TWO) TIMES DAILY.  3  . sucralfate (CARAFATE) 1 g tablet Take 1 g by mouth 4 (four) times daily -  with meals and at bedtime.    . Vitamin D, Cholecalciferol, 1000 UNITS CAPS Take 1,000 mg by mouth.    Marland Kitchen buPROPion (WELLBUTRIN SR) 150 MG 12 hr tablet Take by mouth.    . estradiol (ESTRACE) 1 MG tablet Take 1 tablet (1 mg total) by mouth daily. (Patient not taking: Reported on 05/17/2017) 90 tablet 1   No current facility-administered medications on file prior to visit.     Allergies  Allergen Reactions  . Flagyl [Metronidazole] Anaphylaxis  . Iodinated Diagnostic Agents Anaphylaxis  . Sulfa Drugs Cross Reactors     hives    Social History   Social History  . Marital status: Single    Spouse name: N/A  . Number of children: N/A  . Years of education: N/A  Occupational History  . Not on file.   Social History Main Topics  . Smoking status: Current Every Day Smoker    Packs/day: 0.50    Types: Cigarettes  . Smokeless tobacco: Never Used  . Alcohol use Yes     Comment: socially  . Drug use: No  . Sexual activity: Yes    Birth control/ protection: Surgical   Other Topics Concern  . Not on file   Social History Narrative  . No narrative on file    Family History  Problem Relation Age of Onset  . Diabetes Paternal Aunt   . Heart disease Paternal Grandfather   . Cancer Neg Hx   . Ovarian cancer Neg Hx   . Breast cancer Neg Hx   . Colon cancer Neg Hx     The following portions of the patient's history were reviewed and updated as appropriate: allergies, current medications, past family history, past medical history, past social history, past surgical history and problem list.  Review of Systems Review of Systems - Negative except as mentioned in HPI Review of Systems -  General ROS: negative for - chills, fatigue, fever, hot flashes, malaise or night sweats Hematological and Lymphatic ROS: negative for - bleeding problems or swollen lymph nodes Gastrointestinal ROS: negative for - abdominal pain, blood in stools, change in bowel habits and nausea/vomiting Musculoskeletal ROS: negative for - joint pain, muscle pain or muscular weakness Genito-Urinary ROS: negative for - change in menstrual cycle, dysmenorrhea, dyspareunia, dysuria, genital discharge, genital ulcers, hematuria, incontinence, irregular/heavy menses, nocturia or pelvic pain. Positive for Vaginal pain, burning and swelling  Objective:   BP 113/71 (BP Location: Left Arm, Patient Position: Sitting, Cuff Size: Normal)   Pulse 74   Ht 5\' 2"  (1.575 m)   Wt 140 lb 3.2 oz (63.6 kg)   BMI 25.64 kg/m  CONSTITUTIONAL: Well-developed, well-nourished female in no acute distress.  HENT:  Normocephalic, atraumatic.  NECK: Normal range of motion, supple, no masses.  Normal thyroid.  SKIN: Skin is warm and dry. No rash noted. Not diaphoretic. No erythema. No pallor. [place]: Alert and oriented to person, place, and time. PSYCHIATRIC: Normal mood and affect. Normal behavior. Normal judgment and thought content. CARDIOVASCULAR:Not Examined RESPIRATORY: Not Examined BREASTS: Not Examined ABDOMEN: Soft, non distended; Non tender.  No Organomegaly. PELVIC:  External Genitalia: Normal, Labia minora redness  BUS: Normal  Vagina: Normal, white discharge, no odor  Cervix: Normal   MUSCULOSKELETAL: Normal range of motion. No tenderness.  No cyanosis, clubbing, or edema.     Assessment:   1. Dysuria - POCT urinalysis dipstick Vaginal irriation -Nuswab   Wet prep: positive for clue cells, positive whiff test, positive for yeast.    Plan:   Order sent to pharmacy on file to treat yeast and BV, Nuswab to check for STD. Will call with results.   Philip Aspen, CNM

## 2017-05-20 ENCOUNTER — Telehealth: Payer: Self-pay | Admitting: Certified Nurse Midwife

## 2017-05-20 NOTE — Telephone Encounter (Signed)
Patient is still having a lot of itching when she urinates - like fire ants and she is also having some irritation on her tongue and the roof of her mouth   Please call

## 2017-05-22 ENCOUNTER — Telehealth: Payer: Self-pay | Admitting: Certified Nurse Midwife

## 2017-05-22 ENCOUNTER — Other Ambulatory Visit: Payer: Self-pay | Admitting: Certified Nurse Midwife

## 2017-05-22 MED ORDER — TRIAMCINOLONE ACETONIDE 0.1 % EX CREA: 1.0000 "application " | TOPICAL_CREAM | Freq: Two times a day (BID) | CUTANEOUS | 0 refills | Status: DC

## 2017-05-22 MED ORDER — CLINDAMYCIN PHOSPHATE 100 MG VA SUPP: 100.0000 mg | Freq: Every day | VAGINAL | 0 refills | Status: AC

## 2017-05-22 NOTE — Telephone Encounter (Signed)
Still having the same issues and only has 3 pills left - hasn't helped at all - feeling worse actually   Please call - patient doesn't have access to [place] (she only has the one for [facility])  and wants a nurse to call her

## 2017-05-22 NOTE — Telephone Encounter (Signed)
Please call patient asap - she is burning and hurting really bad  She is out of town and will need something called into a pharmacy where she is   Please call

## 2017-05-22 NOTE — Progress Notes (Signed)
Pt complains of continued burning/itching. Nuswab results have not resulted at this time. Due to pt allergy to Flagyl , clindamycin vaginal suppository ordered. Pt instructed to take x3 days at bedtime, follow with second dose of diflucan. Triamcinolone cream ordered and instructed to use after completion of clindamycin/diflucan if she continues to have itching.She will then schedule a follow up appointment once she returns home from vacation.  She agrees to plan.  Philip Aspen, CNM

## 2017-05-24 LAB — NUSWAB VAGINITIS PLUS (VG+)
Atopobium vaginae: HIGH Score — AB
CANDIDA GLABRATA, NAA: NEGATIVE
CHLAMYDIA TRACHOMATIS, NAA: NEGATIVE
Candida albicans, NAA: POSITIVE — AB
Megasphaera 1: HIGH Score — AB
NEISSERIA GONORRHOEAE, NAA: NEGATIVE
TRICH VAG BY NAA: NEGATIVE

## 2017-05-28 ENCOUNTER — Telehealth: Payer: Self-pay | Admitting: Certified Nurse Midwife

## 2017-05-28 NOTE — Telephone Encounter (Signed)
Pt returned from vacation. Discussed possibility of lingering irritation due to hot climate, waring bathing suit. Encourage use of lavender oil and coconut oil topically for a few days. If symptom do not improve return to office for evaluation. She agrees to plan   Philip Aspen, CNM

## 2017-05-28 NOTE — Telephone Encounter (Signed)
Patient called stating she still has some vaginal irritation. She didn't know what she should do now.

## 2017-06-03 ENCOUNTER — Encounter: Payer: Self-pay | Admitting: Obstetrics and Gynecology

## 2017-06-03 ENCOUNTER — Ambulatory Visit (INDEPENDENT_AMBULATORY_CARE_PROVIDER_SITE_OTHER): Payer: 59 | Admitting: Obstetrics and Gynecology

## 2017-06-03 VITALS — BP 132/84 | HR 94 | Ht 62.0 in | Wt 140.2 lb

## 2017-06-03 DIAGNOSIS — R319 Hematuria, unspecified: Secondary | ICD-10-CM

## 2017-06-03 DIAGNOSIS — R300 Dysuria: Secondary | ICD-10-CM

## 2017-06-03 DIAGNOSIS — B9689 Other specified bacterial agents as the cause of diseases classified elsewhere: Secondary | ICD-10-CM

## 2017-06-03 DIAGNOSIS — N760 Acute vaginitis: Secondary | ICD-10-CM

## 2017-06-03 LAB — POCT URINALYSIS DIPSTICK
Bilirubin, UA: NEGATIVE
Glucose, UA: NEGATIVE
Ketones, UA: NEGATIVE
NITRITE UA: NEGATIVE
PROTEIN UA: NEGATIVE
SPEC GRAV UA: 1.01 (ref 1.010–1.025)
Urobilinogen, UA: 0.2 E.U./dL
pH, UA: 7 (ref 5.0–8.0)

## 2017-06-03 MED ORDER — FLUCONAZOLE 150 MG PO TABS: 150.0000 mg | ORAL_TABLET | ORAL | 0 refills | Status: DC

## 2017-06-03 MED ORDER — CLINDAMYCIN PHOSPHATE 100 MG VA SUPP: 100.0000 mg | Freq: Every day | VAGINAL | 0 refills | Status: DC

## 2017-06-03 NOTE — Progress Notes (Signed)
Chief complaint: 1.vulvar burning 2. Vaginal discharge 3. UTI symptoms  Kayla Sellers presents for follow-up. She was treated in the past 10 days for bacterial vaginosis and Monilia vaginitis. She has noted persistent vulvar burning and vaginal discharge. She would like to be reassessed for ongoing infection. She is concerned about possible HSV outbreak and UTI symptoms.  Past Medical History:  Diagnosis Date  . Acid reflux   . Chronic pelvic pain in female   . Colon polyps   . Diverticulosis   . Dysmenorrhea   . Endometriosis   . Fibroid   . HA (headache)   . HSV-2 (herpes simplex virus 2) infection   . Left flank pain   . Limb deformities, congenital   . Menopausal state   . Panic disorder   . Thyroid disease    hypothyroidism  . Tobacco user   . Vaginal dryness   . VIN III (vulvar intraepithelial neoplasia III)    s/p wle   Past Surgical History:  Procedure Laterality Date  . CESAREAN SECTION    . ESOPHAGOGASTRODUODENOSCOPY (EGD) WITH PROPOFOL N/A 12/02/2015   Procedure: ESOPHAGOGASTRODUODENOSCOPY (EGD) WITH PROPOFOL;  Surgeon: Manya Silvas, MD;  Location: [facility] ENDOSCOPY;  Service: Endoscopy;  Laterality: N/A;  . HAND SURGERY Right    bone graft  . OOPHORECTOMY Right   . TUBAL LIGATION    . VAGINAL HYSTERECTOMY    . wide local excision  2013   pos margin   OBJECTIVE: BP 132/84   Pulse 94   Ht 5\' 2"  (1.575 m)   Wt 140 lb 3.2 oz (63.6 kg)   BMI 25.64 kg/m  Pleasant well-appearing female in no acute distress. Alert and oriented. Abdomen: Soft, nontender. Pelvic exam: External genitalia-normal BUS-normal Vagina-decreased estrogen effect; minimal secretions present Cervix-surgically absent Uterus-surgically absent Adnexa-nonpalpable and nontender Rectovaginal-normal external exam  PROCEDURE: Wet prep KOH-no hyphae Normal saline-few white blood cells; few clue cells; no Trichomonas  ASSESSMENT: 1. Possible residual sexual vaginosis present 2. Flagyl  allergy  PLAN: 1. Clindamycin vaginal suppositories daily for 3 days 2. Diflucan 150 mg orally every 3 days 2 doses 3. Urinalysis and urine culture  A total of 15 minutes were spent face-to-face with the patient during this encounter and over half of that time dealt with counseling and coordination of care.  Brayton Mars, MD  Note: This dictation was prepared with Dragon dictation along with smaller phrase technology. Any transcriptional errors that result from this process are unintentional.

## 2017-06-04 NOTE — Patient Instructions (Addendum)
1. Clindamycin suppositories daily for 3 days 2. Diflucan 150 mg orally every 3 days 2 doses 3. Follow-up when necessary 4. Patient may take Valtrex 500 mg twice a day for 5 days for possible HSV outbreak

## 2017-06-05 LAB — URINE CULTURE: ORGANISM ID, BACTERIA: NO GROWTH

## 2017-06-11 ENCOUNTER — Telehealth: Payer: Self-pay | Admitting: Obstetrics and Gynecology

## 2017-06-11 NOTE — Telephone Encounter (Signed)
Pt is requesting to speak with Crystal to discuss her lab results and discuss problems she is still having. Please advise. Thanks TNP

## 2017-06-12 NOTE — Telephone Encounter (Signed)
Pt aware urine culture neg. S/p clindamycin. Took last difucan Monday. Pt states she is still having vulvar burning. Advised to contact office next Monday is sx persist.

## 2017-06-14 ENCOUNTER — Other Ambulatory Visit: Payer: Self-pay | Admitting: Internal Medicine

## 2017-06-14 DIAGNOSIS — E782 Mixed hyperlipidemia: Secondary | ICD-10-CM | POA: Diagnosis not present

## 2017-06-14 DIAGNOSIS — Z1231 Encounter for screening mammogram for malignant neoplasm of breast: Secondary | ICD-10-CM

## 2017-06-14 DIAGNOSIS — E079 Disorder of thyroid, unspecified: Secondary | ICD-10-CM | POA: Diagnosis not present

## 2017-06-19 ENCOUNTER — Encounter: Payer: Self-pay | Admitting: Obstetrics and Gynecology

## 2017-06-19 ENCOUNTER — Ambulatory Visit (INDEPENDENT_AMBULATORY_CARE_PROVIDER_SITE_OTHER): Payer: 59 | Admitting: Obstetrics and Gynecology

## 2017-06-19 VITALS — BP 120/79 | HR 73 | Ht 62.0 in | Wt 142.4 lb

## 2017-06-19 DIAGNOSIS — L298 Other pruritus: Secondary | ICD-10-CM

## 2017-06-19 DIAGNOSIS — N9489 Other specified conditions associated with female genital organs and menstrual cycle: Secondary | ICD-10-CM

## 2017-06-19 DIAGNOSIS — N898 Other specified noninflammatory disorders of vagina: Secondary | ICD-10-CM

## 2017-06-19 NOTE — Progress Notes (Signed)
Dragon/transcription Corrupted (note documentation limited). BILLING IS BASED ON TIME  Chief complaint: 1.  Vulvar burning. 2.  Vaginal itching.  Recent treatment for BV and Candida. Patient remained symptomatic. Patient is taking daily Valtrex at this time. Partner has HPV of throat; no oral sex is ongoing. Past personal history of VIN 3; last colposcopy was normal.  OBJECTIVE: BP 120/79   Pulse 73   Ht 5\' 2"  (1.575 m)   Wt 142 lb 6.4 oz (64.6 kg)   BMI 26.05 kg/m  Pelvic: External genitalia-normal. BUS-normal Vagina-white secretions  PROCEDURE: Wet prep. Normal saline-No clue cells; no significant white blood cells; no Trichomonas. KOH-no yeast  ASSESSMENT: 1.  Chronic vulvitis, persisting. 2.  Status post treatment for BV and Candida. 3.  History of HSV, on Valtrex suppression. 4.  Partner history notable for HPV of throat. 5.  History of VIN 3  PLAN: 1.  Wet prep is noted. 2.  New swab of the vagina. 3.  Return in 1 week for vulvar colposcopy and biopsy. 4.  Continue Valtrex prophylaxis  A total of 15 minutes were spent face-to-face with the patient during this encounter and over half of that time dealt with counseling and coordination of care.  Brayton Mars, MD

## 2017-06-19 NOTE — Patient Instructions (Signed)
1.  Wet prep today is negative for abnormality. 2.  New swab is obtained to rule out vaginitis. 3.  Return in 1 week for colposcopy to assess vulvar burning. 4.  Continue taking Valtrex daily

## 2017-06-21 ENCOUNTER — Ambulatory Visit
Admission: RE | Admit: 2017-06-21 | Discharge: 2017-06-21 | Disposition: A | Discharge status: Home / Self Care | Payer: 59 | Source: Ambulatory Visit | Attending: Internal Medicine | Admitting: Internal Medicine

## 2017-06-21 DIAGNOSIS — Z79899 Other long term (current) drug therapy: Secondary | ICD-10-CM | POA: Diagnosis not present

## 2017-06-21 DIAGNOSIS — Z1231 Encounter for screening mammogram for malignant neoplasm of breast: Secondary | ICD-10-CM | POA: Insufficient documentation

## 2017-06-21 DIAGNOSIS — E079 Disorder of thyroid, unspecified: Secondary | ICD-10-CM | POA: Diagnosis not present

## 2017-06-21 DIAGNOSIS — E7800 Pure hypercholesterolemia, unspecified: Secondary | ICD-10-CM | POA: Diagnosis not present

## 2017-06-21 LAB — NUSWAB BV AND CANDIDA, NAA
Candida albicans, NAA: NEGATIVE
Candida glabrata, NAA: NEGATIVE

## 2017-06-27 ENCOUNTER — Encounter: Payer: 59 | Admitting: Obstetrics and Gynecology

## 2017-07-01 NOTE — Progress Notes (Deleted)
ANNUAL PREVENTATIVE CARE GYN  ENCOUNTER NOTE  Subjective:       Kayla Sellers is a 51 y.o. [id number]. female here for a routine annual gynecologic exam.  Current complaints: 1.  Annual gynecologic exam.   Has no urinary or bowel complaints at this time.     Gynecologic History No LMP recorded. Patient has had a hysterectomy. Status post TVH Contraception: status post hysterectomy  Status post laparoscopic RSO for chronic pelvic pain/endometriosis/pelvic adhesive disease Last Pap: 04/2014 neg/neg. Results were: normal Last mammogram: 05/2017  birad 1. 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 Acid reflux   . Chronic pelvic pain in female   . Colon polyps   . Diverticulosis   . Dysmenorrhea   . Endometriosis   . Fibroid   . HA (headache)   . HSV-2 (herpes simplex virus 2) infection   . Left flank pain   . Limb deformities, congenital   . Menopausal state   . Panic disorder   . Thyroid disease    hypothyroidism  . Tobacco user   . Vaginal dryness   . VIN III (vulvar intraepithelial neoplasia III)    s/p wle    Past Surgical History:  Procedure Laterality Date  . CESAREAN SECTION    . ESOPHAGOGASTRODUODENOSCOPY (EGD) WITH PROPOFOL N/A 12/02/2015   Procedure: ESOPHAGOGASTRODUODENOSCOPY (EGD) WITH PROPOFOL;  Surgeon: Manya Silvas, MD;  Location: [facility] ENDOSCOPY;  Service: Endoscopy;  Laterality: N/A;  . HAND SURGERY Right    bone graft  . OOPHORECTOMY Right   . TUBAL LIGATION    . VAGINAL HYSTERECTOMY    . wide local excision  2013   pos margin    Current  Outpatient Prescriptions on File Prior to Visit  Medication Sig Dispense Refill  . clindamycin (CLEOCIN) 100 MG vaginal suppository Place 1 suppository (100 mg total) vaginally at bedtime. 3 suppository 0  . ibuprofen (ADVIL,MOTRIN) 800 MG tablet Take 800 mg by mouth every 8 (eight) hours as needed.      Marland Kitchen levothyroxine (SYNTHROID) 150 MCG tablet Take by mouth.    . NEOMYCIN-POLYMYXIN-HYDROCORTISONE (CORTISPORIN) 1 % SOLN OTIC solution Place in ear(s).    . ranitidine (ZANTAC) 150 MG tablet Take by mouth.    . sucralfate (CARAFATE) 1 g tablet Take 1 g by mouth 4 (four) times daily -  with meals and at bedtime.    . Vitamin D, Cholecalciferol, 1000 UNITS CAPS Take 1,000 mg by mouth.     No current facility-administered medications on file prior to visit.     Allergies  Allergen Reactions  . Flagyl [Metronidazole] Anaphylaxis  . Iodinated Diagnostic Agents Anaphylaxis  . Sulfa Drugs Cross Reactors     hives    Social History   Social History  . Marital status: Single    Spouse name: N/A  . Number of children: N/A  . Years of education:  N/A   Occupational History  . Not on file.   Social History Main Topics  . Smoking status: Current Every Day Smoker    Packs/day: 0.50    Types: Cigarettes  . Smokeless tobacco: Never Used  . Alcohol use Yes     Comment: socially  . Drug use: No  . Sexual activity: Yes    Birth control/ protection: Surgical   Other Topics Concern  . Not on file   Social History Narrative  . No narrative on file    Family History  Problem Relation Age of Onset  . Diabetes Paternal Aunt   . Heart disease Paternal Grandfather   . Cancer Neg Hx   . Ovarian cancer Neg Hx   . Breast cancer Neg Hx   . Colon cancer Neg Hx     The following portions of the patient's history were reviewed and updated as appropriate: allergies, current medications, past family history, past medical history, past social history, past surgical history and problem  list.  Review of Systems ROS  Objective:   There were no vitals taken for this visit. CONSTITUTIONAL: Well-developed, well-nourished female in no acute distress.  PSYCHIATRIC: Normal mood and affect. Normal behavior. Normal judgment and thought content. [place]: Alert and oriented to person, place, and time. Normal muscle tone coordination. No cranial nerve deficit noted. HENT:  Normocephalic, atraumatic EYES: Conjunctivae and EOM are normal. No scleral icterus.  NECK: Normal range of motion, supple, no masses.  Normal thyroid.  SKIN: Skin is warm and dry. No rash noted. Not diaphoretic. No erythema. No pallor. CARDIOVASCULAR: Normal heart rate noted, regular rhythm, no murmur. RESPIRATORY: Clear to auscultation bilaterally. Effort and breath sounds normal, no problems with respiration noted. BREASTS: Symmetric in size. No masses, skin changes, nipple drainage, or lymphadenopathy. ABDOMEN: Soft, normal bowel sounds, no distention noted.  No tenderness, rebound or guarding.  BLADDER: Normal PELVIC:                       External Genitalia: Normal                       BUS: Normal                       Vagina: Normal; vaginal cuff intact; mild tenderness in right lower quadrant with a sense of fullness without discrete mass (probable bowel); no peritoneal signs                       Cervix: surgically absent                       Uterus: surgically absent                       Adnexa: mild right lower quadrant tenderness as noted above; left lower quadrant, nonpalpable, nontender                       RV: External Exam NormaI, No Rectal Masses and Normal Sphincter tone  MUSCULOSKELETAL: Normal range of motion. No tenderness.  No cyanosis, clubbing, or edema.  2+ distal pulses. LYMPHATIC: No Axillary, Supraclavicular, or Inguinal Adenopathy.  Procedures: KOH/ Saline wet mount: Normal epithelial cells no evidence of clue cells or hyphae.   Assessment:   Annual gynecologic  examination 51 y.o. Contraception: status post hysterectomy status post [facility] Status  post RSO Normal BMI Problem List Items Addressed This Visit    Status post hysterectomy   VIN III (vulvar intraepithelial neoplasia III)    Other Visit Diagnoses    Well woman exam with routine gynecological exam    -  Primary   Encounter for screening mammogram for breast cancer            Plan:  Pap: pap/ hpv Mammogram: utd Stool Guaiac Testing:  Not Indicated Labs: thru pcp Routine preventative health maintenance measures emphasized: Exercise/Diet/Weight control, Tobacco Warnings, Alcohol/Substance use risks, Stress Management and Safe Sex Return to [place], CMA    I have seen, interviewed, and examined the patient in conjunction with the [employer].A. student and affirm the diagnosis and management plan. Martin A. DeFrancesco, MD, FACOG  Note: This dictation was prepared with Dragon dictation along with smaller phrase technology. Any transcriptional errors that result from this process are unintentional.

## 2017-07-02 ENCOUNTER — Encounter: Payer: 59 | Admitting: Obstetrics and Gynecology

## 2017-07-09 DIAGNOSIS — H6983 Other specified disorders of Eustachian tube, bilateral: Secondary | ICD-10-CM | POA: Diagnosis not present

## 2017-07-26 NOTE — Progress Notes (Signed)
ANNUAL PREVENTATIVE CARE GYN  ENCOUNTER NOTE  Subjective:       Kayla Sellers is a 51 y.o. [id number]. female here for a routine annual gynecologic exam.  Current complaints: 1.  Annual gynecologic exam.   Has no urinary or bowel complaints at this time. Previously noted vulvar itching and burning has resolved. She did not follow up with colposcopy is recommended at last visit because her symptoms have resolved. She remains asymptomatic at this time. Ferrin is very much trying to proceed with smoking cessation. I have recommended that she go to the employee assistance program for psychology counseling to help with strategies to quit.  Gynecologic History No LMP recorded. Patient has had a hysterectomy. Status post TVH Contraception: status post hysterectomy  Status post laparoscopic RSO for chronic pelvic pain/endometriosis/pelvic adhesive disease Last Pap: 04/2014 neg/neg. Results were: normal Last mammogram: 05/2017  birad 1. 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 Acid reflux   . Chronic pelvic pain in female   . Colon polyps   . Diverticulosis   . Dysmenorrhea   . Endometriosis   . Fibroid   . HA (headache)   . HSV-2 (herpes simplex virus 2) infection   . Left flank pain   . Limb deformities, congenital   . Menopausal state   . Panic disorder   . Thyroid disease    hypothyroidism  . Tobacco user   . Vaginal dryness   . VIN III (vulvar intraepithelial neoplasia III)    s/p wle    Past Surgical History:  Procedure Laterality Date  . CESAREAN SECTION    .  ESOPHAGOGASTRODUODENOSCOPY (EGD) WITH PROPOFOL N/A 12/02/2015   Procedure: ESOPHAGOGASTRODUODENOSCOPY (EGD) WITH PROPOFOL;  Surgeon: Manya Silvas, MD;  Location: [facility] ENDOSCOPY;  Service: Endoscopy;  Laterality: N/A;  . HAND SURGERY Right    bone graft  . OOPHORECTOMY Right   . TUBAL LIGATION    . VAGINAL HYSTERECTOMY    . wide local excision  2013   pos margin    Current Outpatient Prescriptions on File Prior to Visit  Medication Sig Dispense Refill  . clindamycin (CLEOCIN) 100 MG vaginal suppository Place 1 suppository (100 mg total) vaginally at bedtime. 3 suppository 0  . ibuprofen (ADVIL,MOTRIN) 800 MG tablet Take 800 mg by mouth every 8 (eight) hours as needed.      Marland Kitchen levothyroxine (SYNTHROID) 150 MCG tablet Take by mouth.    . NEOMYCIN-POLYMYXIN-HYDROCORTISONE (CORTISPORIN) 1 % SOLN OTIC solution Place in ear(s).    . ranitidine (ZANTAC) 150 MG tablet Take by mouth.    . sucralfate (CARAFATE) 1 g tablet Take 1 g by mouth 4 (four) times daily -  with meals and at bedtime.    . Vitamin D, Cholecalciferol, 1000 UNITS CAPS Take 1,000 mg by mouth.     No current facility-administered medications on file prior to visit.  Allergies  Allergen Reactions  . Flagyl [Metronidazole] Anaphylaxis  . Iodinated Diagnostic Agents Anaphylaxis  . Sulfa Drugs Cross Reactors     hives    Social History   Social History  . Marital status: Single    Spouse name: N/A  . Number of children: N/A  . Years of education: N/A   Occupational History  . Not on file.   Social History Main Topics  . Smoking status: Current Every Day Smoker    Packs/day: 0.50    Types: Cigarettes  . Smokeless tobacco: Never Used  . Alcohol use Yes     Comment: socially  . Drug use: No  . Sexual activity: Yes    Birth control/ protection: Surgical   Other Topics Concern  . Not on file   Social History Narrative  . No narrative on file    Family History  Problem Relation Age of Onset  . Diabetes  Paternal Aunt   . Heart disease Paternal Grandfather   . Cancer Neg Hx   . Ovarian cancer Neg Hx   . Breast cancer Neg Hx   . Colon cancer Neg Hx     The following portions of the patient's history were reviewed and updated as appropriate: allergies, current medications, past family history, past medical history, past social history, past surgical history and problem list.  Review of Systems Review of Systems  Constitutional: Negative.   HENT: Negative.   Eyes: Negative.   Respiratory: Positive for shortness of breath.   Cardiovascular: Negative.   Gastrointestinal: Negative.   Genitourinary: Negative.   Musculoskeletal: Negative.   Skin: Negative.        No current vulvar itching or rash  Neurological: Negative.   Endo/Heme/Allergies: Negative.   Psychiatric/Behavioral: Negative.     Objective:   BP 116/73   Pulse 91   Ht 5\' 2"  (1.575 m)   Wt 139 lb 14.4 oz (63.5 kg)   BMI 25.59 kg/m  CONSTITUTIONAL: Well-developed, well-nourished female in no acute distress. Slightly tearful when discussing tobacco use problem PSYCHIATRIC: Normal mood and affect. Normal behavior. Normal judgment and thought content. [place]: Alert and oriented to person, place, and time. Normal muscle tone coordination. No cranial nerve deficit noted. HENT:  Normocephalic, atraumatic EYES: Conjunctivae and EOM are normal. No scleral icterus.  NECK: Normal range of motion, supple, no masses.  Normal thyroid.  SKIN: Skin is warm and dry. No rash noted. Not diaphoretic. No erythema. No pallor. CARDIOVASCULAR: Normal heart rate noted, regular rhythm, no murmur. RESPIRATORY: Clear to auscultation bilaterally. Effort and breath sounds normal, no problems with respiration noted. Patient noted to get slightly dizzy during increased respirations for lung assessment BREASTS: Symmetric in size. No masses, skin changes, nipple drainage, or lymphadenopathy. ABDOMEN: Soft, normal bowel sounds, no distention noted.   No tenderness, rebound or guarding.  BLADDER: Normal PELVIC:                       External Genitalia: Normal without lesions or rash or erythema                       BUS: Normal                       Vagina: Normal; vaginal cuff intact; no peritoneal signs                       Cervix: surgically absent  Uterus: surgically absent                       Adnexa:  nonpalpable, nontender                       RV: External Exam NormaI, No Rectal Masses and Normal Sphincter tone  MUSCULOSKELETAL: Normal range of motion. No tenderness.  No cyanosis, clubbing, or edema.  2+ distal pulses. LYMPHATIC: No Axillary, Supraclavicular, or Inguinal Adenopathy.   Assessment:   Annual gynecologic examination 51 y.o. Contraception: status post hysterectomy status post TVH Status post RSO Normal BMI Problem List Items Addressed This Visit    Status post hysterectomy   VIN III (vulvar intraepithelial neoplasia III)    Other Visit Diagnoses    Well woman exam with routine gynecological exam    -  Primary   Anxiety       Encounter for screening mammogram for breast cancer            Plan:  Pap: not needed Mammogram: utd Stool Guaiac Testing:  Colonoscopy 2017 Labs: thru pcp Routine preventative health maintenance measures emphasized: Exercise/Diet/Weight control, Tobacco Warnings, Alcohol/Substance use risks, Stress Management and Safe Sex  Return in 6 months for vulvar colposcopy Recommend employee assistance program counseling for smoking cessation Return to [place], CMA  Brayton Mars, MD  Note: This dictation was prepared with Dragon dictation along with smaller phrase technology. Any transcriptional errors that result from this process are unintentional.

## 2017-07-31 ENCOUNTER — Encounter: Payer: Self-pay | Admitting: Obstetrics and Gynecology

## 2017-07-31 ENCOUNTER — Ambulatory Visit (INDEPENDENT_AMBULATORY_CARE_PROVIDER_SITE_OTHER): Payer: 59 | Admitting: Obstetrics and Gynecology

## 2017-07-31 VITALS — BP 116/73 | HR 91 | Ht 62.0 in | Wt 139.9 lb

## 2017-07-31 DIAGNOSIS — Z01419 Encounter for gynecological examination (general) (routine) without abnormal findings: Secondary | ICD-10-CM

## 2017-07-31 DIAGNOSIS — Z720 Tobacco use: Secondary | ICD-10-CM

## 2017-07-31 DIAGNOSIS — D071 Carcinoma in situ of vulva: Secondary | ICD-10-CM | POA: Diagnosis not present

## 2017-07-31 DIAGNOSIS — Z90710 Acquired absence of both cervix and uterus: Secondary | ICD-10-CM | POA: Diagnosis not present

## 2017-07-31 DIAGNOSIS — F419 Anxiety disorder, unspecified: Secondary | ICD-10-CM

## 2017-07-31 NOTE — Patient Instructions (Signed)
1. No Pap smear done. Next Pap smear is due in 2020 2. Mammogram already completed this year 3. Stool guaiac testing is not done because patient has had colonoscopy this year 4. Continue with healthy eating and exercise 5. Patient is encouraged to go to employee assistance program for counseling regarding smoking cessation 6. Patient is to follow-up with Dr. Doy Hutching regarding smoking cessation next month and also to have reassessment of shortness of breath with consideration of chest x-ray 7. Recommend increased roughage and diet along with Colace or Surfak for stool softener treatment of constipation, along with increase water intake 8. Return in 6 months for vulvar colposcopy 9. Return in 1 year for annual exam   Health Maintenance for Postmenopausal Women Menopause is a normal process in which your reproductive ability comes to an end. This process happens gradually over a span of months to years, usually between the ages of 46 and 40. Menopause is complete when you have missed 12 consecutive menstrual periods. It is important to talk with your health care provider about some of the most common conditions that affect postmenopausal women, such as heart disease, cancer, and bone loss (osteoporosis). Adopting a healthy lifestyle and getting preventive care can help to promote your health and wellness. Those actions can also lower your chances of developing some of these common conditions. What should I know about menopause? During menopause, you may experience a number of symptoms, such as:  Moderate-to-severe hot flashes.  Night sweats.  Decrease in sex drive.  Mood swings.  Headaches.  Tiredness.  Irritability.  Memory problems.  Insomnia.  Choosing to treat or not to treat menopausal changes is an individual decision that you make with your health care provider. What should I know about hormone replacement therapy and supplements? Hormone therapy products are effective for  treating symptoms that are associated with menopause, such as hot flashes and night sweats. Hormone replacement carries certain risks, especially as you become older. If you are thinking about using estrogen or estrogen with progestin treatments, discuss the benefits and risks with your health care provider. What should I know about heart disease and stroke? Heart disease, heart attack, and stroke become more likely as you age. This may be due, in part, to the hormonal changes that your body experiences during menopause. These can affect how your body processes dietary fats, triglycerides, and cholesterol. Heart attack and stroke are both medical emergencies. There are many things that you can do to help prevent heart disease and stroke:  Have your blood pressure checked at least every 1-2 years. High blood pressure causes heart disease and increases the risk of stroke.  If you are 56-92 years old, ask your health care provider if you should take aspirin to prevent a heart attack or a stroke.  Do not use any tobacco products, including cigarettes, chewing tobacco, or electronic cigarettes. If you need help quitting, ask your health care provider.  It is important to eat a healthy diet and maintain a healthy weight. ? Be sure to include plenty of vegetables, fruits, low-fat dairy products, and lean protein. ? Avoid eating foods that are high in solid fats, added sugars, or salt (sodium).  Get regular exercise. This is one of the most important things that you can do for your health. ? Try to exercise for at least 150 minutes each week. The type of exercise that you do should increase your heart rate and make you sweat. This is known as moderate-intensity exercise. ? Try  to do strengthening exercises at least twice each week. Do these in addition to the moderate-intensity exercise.  Know your numbers.Ask your health care provider to check your cholesterol and your blood glucose. Continue to have  your blood tested as directed by your health care provider.  What should I know about cancer screening? There are several types of cancer. Take the following steps to reduce your risk and to catch any cancer development as early as possible. Breast Cancer  Practice breast self-awareness. ? This means understanding how your breasts normally appear and feel. ? It also means doing regular breast self-exams. Let your health care provider know about any changes, no matter how small.  If you are 30 or older, have a clinician do a breast exam (clinical breast exam or CBE) every year. Depending on your age, family history, and medical history, it may be recommended that you also have a yearly breast X-ray (mammogram).  If you have a family history of breast cancer, talk with your health care provider about genetic screening.  If you are at high risk for breast cancer, talk with your health care provider about having an MRI and a mammogram every year.  Breast cancer (BRCA) gene test is recommended for women who have family members with BRCA-related cancers. Results of the assessment will determine the need for genetic counseling and BRCA1 and for BRCA2 testing. BRCA-related cancers include these types: ? Breast. This occurs in males or females. ? Ovarian. ? Tubal. This may also be called fallopian tube cancer. ? Cancer of the abdominal or pelvic lining (peritoneal cancer). ? Prostate. ? Pancreatic.  Cervical, Uterine, and Ovarian Cancer Your health care provider may recommend that you be screened regularly for cancer of the pelvic organs. These include your ovaries, uterus, and vagina. This screening involves a pelvic exam, which includes checking for microscopic changes to the surface of your cervix (Pap test).  For women ages 21-65, health care providers may recommend a pelvic exam and a Pap test every three years. For women ages 5-65, they may recommend the Pap test and pelvic exam, combined  with testing for human papilloma virus (HPV), every five years. Some types of HPV increase your risk of cervical cancer. Testing for HPV may also be done on women of any age who have unclear Pap test results.  Other health care providers may not recommend any screening for nonpregnant women who are considered low risk for pelvic cancer and have no symptoms. Ask your health care provider if a screening pelvic exam is right for you.  If you have had past treatment for cervical cancer or a condition that could lead to cancer, you need Pap tests and screening for cancer for at least 20 years after your treatment. If Pap tests have been discontinued for you, your risk factors (such as having a new sexual partner) need to be reassessed to determine if you should start having screenings again. Some women have medical problems that increase the chance of getting cervical cancer. In these cases, your health care provider may recommend that you have screening and Pap tests more often.  If you have a family history of uterine cancer or ovarian cancer, talk with your health care provider about genetic screening.  If you have vaginal bleeding after reaching menopause, tell your health care provider.  There are currently no reliable tests available to screen for ovarian cancer.  Lung Cancer Lung cancer screening is recommended for adults 39-18 years old who are at  high risk for lung cancer because of a history of smoking. A yearly low-dose CT scan of the lungs is recommended if you:  Currently smoke.  Have a history of at least 30 pack-years of smoking and you currently smoke or have quit within the past 15 years. A pack-year is smoking an average of one pack of cigarettes per day for one year.  Yearly screening should:  Continue until it has been 15 years since you quit.  Stop if you develop a health problem that would prevent you from having lung cancer treatment.  Colorectal Cancer  This type of  cancer can be detected and can often be prevented.  Routine colorectal cancer screening usually begins at age 65 and continues through age 55.  If you have risk factors for colon cancer, your health care provider may recommend that you be screened at an earlier age.  If you have a family history of colorectal cancer, talk with your health care provider about genetic screening.  Your health care provider may also recommend using home test kits to check for hidden blood in your stool.  A small camera at the end of a tube can be used to examine your colon directly (sigmoidoscopy or colonoscopy). This is done to check for the earliest forms of colorectal cancer.  Direct examination of the colon should be repeated every 5-10 years until age 35. However, if early forms of precancerous polyps or small growths are found or if you have a family history or genetic risk for colorectal cancer, you may need to be screened more often.  Skin Cancer  Check your skin from head to toe regularly.  Monitor any moles. Be sure to tell your health care provider: ? About any new moles or changes in moles, especially if there is a change in a mole's shape or color. ? If you have a mole that is larger than the size of a pencil eraser.  If any of your family members has a history of skin cancer, especially at a young age, talk with your health care provider about genetic screening.  Always use sunscreen. Apply sunscreen liberally and repeatedly throughout the day.  Whenever you are outside, protect yourself by wearing long sleeves, pants, a wide-brimmed hat, and sunglasses.  What should I know about osteoporosis? Osteoporosis is a condition in which bone destruction happens more quickly than new bone creation. After menopause, you may be at an increased risk for osteoporosis. To help prevent osteoporosis or the bone fractures that can happen because of osteoporosis, the following is recommended:  If you are  50-56 years old, get at least 1,000 mg of calcium and at least 600 mg of vitamin D per day.  If you are older than age 63 but younger than age 78, get at least 1,200 mg of calcium and at least 600 mg of vitamin D per day.  If you are older than age 70, get at least 1,200 mg of calcium and at least 800 mg of vitamin D per day.  Smoking and excessive alcohol intake increase the risk of osteoporosis. Eat foods that are rich in calcium and vitamin D, and do weight-bearing exercises several times each week as directed by your health care provider. What should I know about how menopause affects my mental health? Depression may occur at any age, but it is more common as you become older. Common symptoms of depression include:  Low or sad mood.  Changes in sleep patterns.  Changes in  appetite or eating patterns.  Feeling an overall lack of motivation or enjoyment of activities that you previously enjoyed.  Frequent crying spells.  Talk with your health care provider if you think that you are experiencing depression. What should I know about immunizations? It is important that you get and maintain your immunizations. These include:  Tetanus, diphtheria, and pertussis (Tdap) booster vaccine.  Influenza every year before the flu season begins.  Pneumonia vaccine.  Shingles vaccine.  Your health care provider may also recommend other immunizations. This information is not intended to replace advice given to you by your health care provider. Make sure you discuss any questions you have with your health care provider. Document Released: 11/30/2005 Document Revised: 04/27/2016 Document Reviewed: 07/12/2015 Elsevier Interactive Patient Education  2018 [employer].

## 2017-08-28 DIAGNOSIS — S90112A Contusion of left great toe without damage to nail, initial encounter: Secondary | ICD-10-CM | POA: Diagnosis not present

## 2017-08-28 DIAGNOSIS — S99922A Unspecified injury of left foot, initial encounter: Secondary | ICD-10-CM | POA: Diagnosis not present

## 2017-12-01 DIAGNOSIS — J029 Acute pharyngitis, unspecified: Secondary | ICD-10-CM | POA: Diagnosis not present

## 2017-12-01 DIAGNOSIS — H6502 Acute serous otitis media, left ear: Secondary | ICD-10-CM | POA: Diagnosis not present

## 2017-12-01 DIAGNOSIS — H9202 Otalgia, left ear: Secondary | ICD-10-CM | POA: Diagnosis not present

## 2018-01-06 ENCOUNTER — Ambulatory Visit: Payer: 59 | Admitting: Obstetrics and Gynecology

## 2018-01-06 ENCOUNTER — Encounter: Payer: Self-pay | Admitting: Obstetrics and Gynecology

## 2018-01-06 VITALS — BP 114/69 | HR 82 | Ht 62.0 in | Wt 126.7 lb

## 2018-01-06 DIAGNOSIS — R300 Dysuria: Secondary | ICD-10-CM

## 2018-01-06 DIAGNOSIS — B3731 Acute candidiasis of vulva and vagina: Secondary | ICD-10-CM

## 2018-01-06 DIAGNOSIS — B373 Candidiasis of vulva and vagina: Secondary | ICD-10-CM | POA: Diagnosis not present

## 2018-01-06 DIAGNOSIS — R319 Hematuria, unspecified: Secondary | ICD-10-CM | POA: Diagnosis not present

## 2018-01-06 LAB — POCT URINALYSIS DIPSTICK
Bilirubin, UA: NEGATIVE
Glucose, UA: NEGATIVE
LEUKOCYTES UA: NEGATIVE
Nitrite, UA: NEGATIVE
ODOR: NEGATIVE
PH UA: 6 (ref 5.0–8.0)
Protein, UA: NEGATIVE
Spec Grav, UA: <=1.005 — AB (ref 1.010–1.025)
UROBILINOGEN UA: 0.2 U/dL

## 2018-01-06 MED ORDER — FLUCONAZOLE 150 MG PO TABS: 150.0000 mg | ORAL_TABLET | Freq: Once | ORAL | 0 refills | Status: AC

## 2018-01-06 NOTE — Progress Notes (Signed)
HPI:      [name] is a 52 y.o. [id number] who LMP was No LMP recorded. Patient has had a hysterectomy.  Subjective:   She presents today patient seen today on an emergent basis.  She states that she has had a vaginal discharge with some itching over the last several days.  She does state that she has been on an antibiotic recently for an ear infection. Secondarily she has also been experiencing some "family troubles".  Her ex boyfriend and her 70 year old son have both been increasing her stress levels.  She is intermittently tearful.    Hx: The following portions of the patient's history were reviewed and updated as appropriate:             She  has a past medical history of Acid reflux, Chronic pelvic pain in female, Colon polyps, Diverticulosis, Dysmenorrhea, Endometriosis, Fibroid, HA (headache), HSV-2 (herpes simplex virus 2) infection, Left flank pain, Limb deformities, congenital, Menopausal state, Panic disorder, Thyroid disease, Tobacco user, Vaginal dryness, and VIN III (vulvar intraepithelial neoplasia III). She does not have any pertinent problems on file. She  has a past surgical history that includes wide local excision (2013); Vaginal hysterectomy; Oophorectomy (Right); Hand surgery (Right); Tubal ligation; Cesarean section; and Esophagogastroduodenoscopy (egd) with propofol (N/A, 12/02/2015). Her family history includes Diabetes in her paternal aunt; Heart disease in her paternal grandfather. She  reports that she has been smoking cigarettes.  She has been smoking about 0.50 packs per day. she has never used smokeless tobacco. She reports that she drinks alcohol. She reports that she does not use drugs. She has a current medication list which includes the following prescription(s): ibuprofen, levothyroxine, ranitidine, sucralfate, and valacyclovir. She is allergic to flagyl [metronidazole]; iodinated diagnostic agents; and sulfa drugs cross reactors.       Review of  Systems:  Review of Systems  Constitutional: Denied constitutional symptoms, night sweats, recent illness, fatigue, fever, insomnia and weight loss.  Eyes: Denied eye symptoms, eye pain, photophobia, vision change and visual disturbance.  Ears/Nose/Throat/Neck: Denied ear, nose, throat or neck symptoms, hearing loss, nasal discharge, sinus congestion and sore throat.  Cardiovascular: Denied cardiovascular symptoms, arrhythmia, chest pain/pressure, edema, exercise intolerance, orthopnea and palpitations.  Respiratory: Denied pulmonary symptoms, asthma, pleuritic pain, productive sputum, cough, dyspnea and wheezing.  Gastrointestinal: Denied, gastro-esophageal reflux, melena, nausea and vomiting.  Genitourinary: See HPI for additional information.  Musculoskeletal: Denied musculoskeletal symptoms, stiffness, swelling, muscle weakness and myalgia.  Dermatologic: Denied dermatology symptoms, rash and scar.  Neurologic: Denied neurology symptoms, dizziness, headache, neck pain and syncope.  Psychiatric: Denied psychiatric symptoms, anxiety and depression.  Endocrine: Denied endocrine symptoms including hot flashes and night sweats.   Meds:   Current Outpatient Medications on File Prior to Visit  Medication Sig Dispense Refill  . ibuprofen (ADVIL,MOTRIN) 800 MG tablet Take 800 mg by mouth every 8 (eight) hours as needed.      Marland Kitchen levothyroxine (SYNTHROID) 150 MCG tablet Take by mouth.    . ranitidine (ZANTAC) 150 MG tablet Take by mouth.    . sucralfate (CARAFATE) 1 g tablet Take 1 g by mouth 4 (four) times daily -  with meals and at bedtime.    . valACYclovir (VALTREX) 1000 MG tablet Take 1,000 mg by mouth 2 (two) times daily.     No current facility-administered medications on file prior to visit.     Objective:     Vitals:   01/06/18 1109  BP: 114/69  Pulse: 82  Physical examination   Pelvic:   Vulva: Normal appearance.  No lesions.  Vagina: No lesions or abnormalities  noted.  Thick white vaginal discharge  Support: Normal pelvic support.  Urethra No masses tenderness or scarring.  Meatus Normal size without lesions or prolapse.  Cervix:  Surgically absent  Anus: Normal exam.  No lesions.  Perineum: Normal exam.  No lesions.        Bimanual   Uterus:  Surgically absent  Adnexae: No masses.  Non-tender to palpation.  Cul-de-sac: Negative for abnormality.   WET PREP: clue cells: absent, KOH (yeast): positive, odor: absent and trichomoniasis: negative Ph:  < 4.5    Assessment:    [id number] Patient Active Problem List   Diagnosis Date Noted  . 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 Status post hysterectomy 11/10/2015  . 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/10/2015  . Chronic abdominal pain 05/03/2015  . Chronic headache 05/03/2015  . Colon polyp 05/03/2015  . DD (diverticular disease) 05/03/2015  . Deformity of hand 05/03/2015  . Genital herpes 05/03/2015  . Episodic paroxysmal anxiety disorder 05/03/2015  . Disease of thyroid gland 05/03/2015  . Compulsive tobacco user syndrome 05/03/2015  . Congenital limb deformity 07/16/2011  . Hypothyroidism 07/16/2011     1. Dysuria   2. Hematuria, unspecified type   3. Monilial vulvovaginitis     Patient declined consideration of antidepressant medication.  Have discussed the effect of menopause and mood changes possibly exacerbating her difficult family situation.  Use of low-dose estrogen discussed.     Plan:            1.  Diflucan  2.  Small amount of blood in the urine-sent for culture-patient states that she has seen a urologist for this in the past and this is her "normal".  Urine sent for C&S.  3.  Discussed menopause and mood changes patient will consider estrogen with Dr. Enzo Bi.  Orders Orders Placed This Encounter  Procedures  . Urine Culture  . POCT urinalysis dipstick    No orders of the defined types were placed in this encounter.     F/U  No Follow-up on  file.  Finis Bud, M.D. 01/06/2018 11:55 AM

## 2018-01-08 LAB — URINE CULTURE: ORGANISM ID, BACTERIA: NO GROWTH

## 2018-01-09 NOTE — Telephone Encounter (Signed)
Error

## 2018-01-15 ENCOUNTER — Telehealth: Payer: Self-pay | Admitting: Obstetrics and Gynecology

## 2018-01-15 NOTE — Telephone Encounter (Signed)
The patient called and stated that she would like to speak with Joyice Faster in regards to her results and MyChart. Please advise.

## 2018-01-15 NOTE — Telephone Encounter (Signed)
lmtrc

## 2018-01-16 DIAGNOSIS — H6502 Acute serous otitis media, left ear: Secondary | ICD-10-CM | POA: Diagnosis not present

## 2018-01-16 DIAGNOSIS — H9202 Otalgia, left ear: Secondary | ICD-10-CM | POA: Diagnosis not present

## 2018-01-16 NOTE — Telephone Encounter (Signed)
lmtrc

## 2018-01-17 NOTE — Telephone Encounter (Signed)
lmtrc  Lm x 3- no response.  Chart filed.

## 2018-01-27 DIAGNOSIS — H6983 Other specified disorders of Eustachian tube, bilateral: Secondary | ICD-10-CM | POA: Diagnosis not present

## 2018-01-27 DIAGNOSIS — J310 Chronic rhinitis: Secondary | ICD-10-CM | POA: Diagnosis not present

## 2018-01-29 ENCOUNTER — Encounter: Payer: 59 | Admitting: Obstetrics and Gynecology

## 2018-03-12 ENCOUNTER — Encounter: Payer: Self-pay | Admitting: Obstetrics and Gynecology

## 2018-03-12 ENCOUNTER — Ambulatory Visit (INDEPENDENT_AMBULATORY_CARE_PROVIDER_SITE_OTHER): Payer: 59 | Admitting: Obstetrics and Gynecology

## 2018-03-12 VITALS — BP 136/91 | HR 92 | Ht 62.0 in | Wt 116.2 lb

## 2018-03-12 DIAGNOSIS — R634 Abnormal weight loss: Secondary | ICD-10-CM

## 2018-03-12 DIAGNOSIS — N763 Subacute and chronic vulvitis: Secondary | ICD-10-CM | POA: Diagnosis not present

## 2018-03-12 DIAGNOSIS — N951 Menopausal and female climacteric states: Secondary | ICD-10-CM

## 2018-03-12 DIAGNOSIS — N949 Unspecified condition associated with female genital organs and menstrual cycle: Secondary | ICD-10-CM | POA: Diagnosis not present

## 2018-03-12 LAB — POCT URINALYSIS DIPSTICK
BILIRUBIN UA: NEGATIVE
GLUCOSE UA: NEGATIVE
KETONES UA: NEGATIVE
Leukocytes, UA: NEGATIVE
Nitrite, UA: NEGATIVE
ODOR: NEGATIVE
PROTEIN UA: NEGATIVE
Spec Grav, UA: <=1.005 — AB (ref 1.010–1.025)
Urobilinogen, UA: 0.2 E.U./dL
pH, UA: 6 (ref 5.0–8.0)

## 2018-03-12 NOTE — Progress Notes (Signed)
Chief complaint: 1.  Vasomotor symptoms 2.  Vulvar and vaginal burning 3.  Weight loss, unexpected  Kayla Sellers presents for vulvar colposcopy and evaluation of vasomotor symptoms and weight loss. [name] went on a weight loss program hoping to lose 10 pounds recently; however, she has continued to lose weight after stopping the program.  She is also experiencing some hot flashes.  She also is experiencing new onset vulvar burning without dysuria.  Patient has past history of VIN 3, status post wide local excision with positive margins. She also has history of HSV. She is a tobacco user.  Past medical history, past surgical history, problem list, medications, and allergies are reviewed  OBJECTIVE: BP (!) 136/91   Pulse 92   Ht 5\' 2"  (1.575 m)   Wt 116 lb 3.2 oz (52.7 kg)   BMI 21.25 kg/m  Pleasant female no acute distress.  Alert and oriented.  Affect is appropriate. Pelvic exam: External genitalia-normal BUS-normal Vagina-normal without obvious lesions Extremities: Warm and dry Skin: No rashes  PROCEDURE: Vulvar colposcopy with biopsies Indications: History of VIN 3; new onset vulvar burning Findings: Symmetric pattern of acetowhite epithelium at the introitus; labia majora normal Biopsies:  Labia minora, left-  Labia majora, right- Description: Verbal consent is obtained.  Patient is placed in dorsolithotomy position.  The vulva is soaked with acetic acid saturated 4 x 4's for 2 minutes.  Colposcopy was performed with the above-noted findings.  The labia majora epithelium at the introitus on the left and right sides is injected with 1% lidocaine without epinephrine using 4 cc of anesthetic.  3.0 mm punch biopsy was taken at each biopsy site.  Silver nitrate is used for hemostasis.  Blood loss is minimal.  Complications are none.  Patient tolerated procedure well.  ASSESSMENT: 1.  Vasomotor symptoms 2.  Unexplained weight loss 3.  Tobacco user 4.  History of VIN 3 status post  bilateral excision 5.  Vulvar burning  PLAN: 1.  Vulvar biopsies as noted 2.  [place] 3.  TSH 4.  Return in 10 days for follow-up  A total of 15 minutes were spent face-to-face with the patient during this encounter and over half of that time dealt with counseling and coordination of care.  Brayton Mars, MD  Note: This dictation was prepared with Dragon dictation along with smaller phrase technology. Any transcriptional errors that result from this process are unintentional.

## 2018-03-12 NOTE — Patient Instructions (Signed)
1.  FSH and TSH are obtained today to assess hot flashes and weight loss 2.  Colposcopy of the vulva with biopsies is performed today. 3.  Return in 10 days for follow-up on lab results and pathology 4.  Smoking cessation strongly encouraged  VULVAR BIOPSY POST-PROCEDURE INSTRUCTIONS  1. You may take Ibuprofen, Aleve or Tylenol for pain if needed.    2. You may have a small amount of spotting.  You should wear a mini pad for the next few days.  3. You may use some topical Neosporin ointment if you would like (over the counter is fine).  4. You need to call if you have redness around the biopsy site, if there is any unusual draining, if the bleeding is heavy, or if you are concerned.  5. Shower or bathe as normal  6. We will call you within one week with results or we will discuss the results at your follow-up appointment if needed.

## 2018-03-13 LAB — FOLLICLE STIMULATING HORMONE: FSH: 69.3 m[IU]/mL

## 2018-03-13 LAB — TSH: TSH: 1.22 u[IU]/mL (ref 0.450–4.500)

## 2018-03-14 LAB — URINE CULTURE: ORGANISM ID, BACTERIA: NO GROWTH

## 2018-03-15 LAB — PATHOLOGY

## 2018-03-25 ENCOUNTER — Ambulatory Visit (INDEPENDENT_AMBULATORY_CARE_PROVIDER_SITE_OTHER): Payer: 59 | Admitting: Obstetrics and Gynecology

## 2018-03-25 ENCOUNTER — Encounter: Payer: Self-pay | Admitting: Obstetrics and Gynecology

## 2018-03-25 VITALS — BP 142/83 | HR 103 | Ht 62.0 in | Wt 115.6 lb

## 2018-03-25 DIAGNOSIS — Z90710 Acquired absence of both cervix and uterus: Secondary | ICD-10-CM

## 2018-03-25 DIAGNOSIS — N951 Menopausal and female climacteric states: Secondary | ICD-10-CM

## 2018-03-25 DIAGNOSIS — N949 Unspecified condition associated with female genital organs and menstrual cycle: Secondary | ICD-10-CM

## 2018-03-25 DIAGNOSIS — R300 Dysuria: Secondary | ICD-10-CM

## 2018-03-25 DIAGNOSIS — R319 Hematuria, unspecified: Secondary | ICD-10-CM | POA: Diagnosis not present

## 2018-03-25 DIAGNOSIS — N9489 Other specified conditions associated with female genital organs and menstrual cycle: Secondary | ICD-10-CM

## 2018-03-25 LAB — POCT URINALYSIS DIPSTICK
Bilirubin, UA: NEGATIVE
Glucose, UA: NEGATIVE
Ketones, UA: NEGATIVE
LEUKOCYTES UA: NEGATIVE
NITRITE UA: NEGATIVE
Odor: NEGATIVE
PROTEIN UA: NEGATIVE
UROBILINOGEN UA: 0.2 U/dL
pH, UA: 6 (ref 5.0–8.0)

## 2018-03-25 MED ORDER — ESTRADIOL 1 MG PO TABS: 1.0000 mg | ORAL_TABLET | Freq: Every day | ORAL | 12 refills | Status: DC

## 2018-03-25 NOTE — Progress Notes (Signed)
Chief complaint: 1.  Menopausal symptoms 2.  Vulvar burning 3.  Vulvar biopsy follow-up 4.  History of VIN 5.  Dysuria  Kayla Sellers presents for follow-up.  [place] recently drawn is consistent with menopause.  TSH was normal.  She would like to consider a trial of estrogen replacement therapy.  Kayla Sellers has persistent vulvar burning associated with UTI symptoms.  Colposcopic directed biopsies demonstrated benign vulvar tissue without evidence of VIN.  She does have history of HSV.  Dysuria symptoms are present today; urinalysis is negative; urine culture sent  Past medical history, past surgical history, problem list, medications, and allergies are reviewed  OBJECTIVE: BP (!) 142/83   Pulse (!) 103   Ht 5\' 2"  (1.575 m)   Wt 115 lb 9.6 oz (52.4 kg)   BMI 21.14 kg/m   Pleasant female no acute distress.  Alert and oriented. Pelvic exam: External genitalia-healed biopsy sites; no evidence of leukoplakia, ulceration, or epithelial skin breakdown BUS-normal Vagina-fair estrogen effect  ASSESSMENT: 1.  Symptomatic menopausal state with elevated FSH including hot flashes and night sweats and emotional lability 2.  History of VIN; status post vulvar colposcopy with possible acetowhite epithelium, biopsies were negative for dysplasia 3.  Chronic vulvar burning 4.  History of HSV 5.  UTI symptoms with normal urinalysis today except for some blood.  PLAN: 1.  Begin estradiol 1 mg daily 2.  Return in 6 weeks for follow-up on vasomotor symptoms, hot flashes, and emotional lability 3.  Urine culture is sent 4.  Return in 6 months for vulvar colposcopy  A total of 15 minutes were spent face-to-face with the patient during this encounter and over half of that time dealt with counseling and coordination of care.  Brayton Mars, MD  Note: This dictation was prepared with Dragon dictation along with smaller phrase technology. Any transcriptional errors that result from this process are  unintentional.

## 2018-03-25 NOTE — Patient Instructions (Signed)
  1.  Begin estradiol 1 mg a day for vasomotor symptoms/menopause 2.  Return in 6 weeks for follow-up 3.  Return in 6 months for colposcopy of the vulva to assess VIN

## 2018-03-27 DIAGNOSIS — E782 Mixed hyperlipidemia: Secondary | ICD-10-CM | POA: Diagnosis not present

## 2018-03-27 DIAGNOSIS — Z79899 Other long term (current) drug therapy: Secondary | ICD-10-CM | POA: Diagnosis not present

## 2018-03-27 DIAGNOSIS — I10 Essential (primary) hypertension: Secondary | ICD-10-CM | POA: Diagnosis not present

## 2018-03-27 LAB — URINE CULTURE: ORGANISM ID, BACTERIA: NO GROWTH

## 2018-05-06 ENCOUNTER — Encounter: Payer: 59 | Admitting: Obstetrics and Gynecology

## 2018-05-12 DIAGNOSIS — R51 Headache: Secondary | ICD-10-CM | POA: Diagnosis not present

## 2018-07-11 DIAGNOSIS — K219 Gastro-esophageal reflux disease without esophagitis: Secondary | ICD-10-CM | POA: Diagnosis not present

## 2018-07-11 DIAGNOSIS — E782 Mixed hyperlipidemia: Secondary | ICD-10-CM | POA: Diagnosis not present

## 2018-07-11 DIAGNOSIS — E079 Disorder of thyroid, unspecified: Secondary | ICD-10-CM | POA: Diagnosis not present

## 2018-08-12 DIAGNOSIS — M1712 Unilateral primary osteoarthritis, left knee: Secondary | ICD-10-CM | POA: Diagnosis not present

## 2018-08-12 DIAGNOSIS — M25562 Pain in left knee: Secondary | ICD-10-CM | POA: Diagnosis not present

## 2018-08-22 DIAGNOSIS — L508 Other urticaria: Secondary | ICD-10-CM | POA: Diagnosis not present

## 2018-08-27 ENCOUNTER — Other Ambulatory Visit: Payer: Self-pay | Admitting: Internal Medicine

## 2018-08-27 DIAGNOSIS — Z1231 Encounter for screening mammogram for malignant neoplasm of breast: Secondary | ICD-10-CM

## 2018-08-28 ENCOUNTER — Ambulatory Visit
Admission: RE | Admit: 2018-08-28 | Discharge: 2018-08-28 | Disposition: A | Discharge status: Home / Self Care | Payer: 59 | Source: Ambulatory Visit | Attending: Internal Medicine | Admitting: Internal Medicine

## 2018-08-28 DIAGNOSIS — Z1231 Encounter for screening mammogram for malignant neoplasm of breast: Secondary | ICD-10-CM | POA: Diagnosis not present

## 2018-09-23 DIAGNOSIS — M79671 Pain in right foot: Secondary | ICD-10-CM | POA: Diagnosis not present

## 2018-09-23 DIAGNOSIS — S90934A Unspecified superficial injury of right lesser toe(s), initial encounter: Secondary | ICD-10-CM | POA: Diagnosis not present

## 2018-09-23 DIAGNOSIS — S99921A Unspecified injury of right foot, initial encounter: Secondary | ICD-10-CM | POA: Diagnosis not present

## 2018-09-24 ENCOUNTER — Encounter: Payer: Self-pay | Admitting: Obstetrics and Gynecology

## 2018-09-24 ENCOUNTER — Ambulatory Visit (INDEPENDENT_AMBULATORY_CARE_PROVIDER_SITE_OTHER): Payer: 59 | Admitting: Obstetrics and Gynecology

## 2018-09-24 DIAGNOSIS — N951 Menopausal and female climacteric states: Secondary | ICD-10-CM

## 2018-09-24 DIAGNOSIS — Z90710 Acquired absence of both cervix and uterus: Secondary | ICD-10-CM

## 2018-09-24 DIAGNOSIS — D071 Carcinoma in situ of vulva: Secondary | ICD-10-CM | POA: Diagnosis not present

## 2018-09-24 MED ORDER — VALACYCLOVIR HCL 1 G PO TABS: 1000.0000 mg | ORAL_TABLET | Freq: Two times a day (BID) | ORAL | 3 refills | Status: AC

## 2018-09-24 NOTE — Patient Instructions (Signed)
1.  Vulvar colposcopy is normal today. 2.  Return in 6 months for annual exam-Dr. Amalia Hailey 3.  Return in 1 year for repeat vulvar colposcopy, or sooner if vulvar itching or burning worsens

## 2018-09-24 NOTE — Progress Notes (Signed)
Chief complaint: 1.  History of VIN 3 2.  History of chronic vulvitis 3.  Colposcopy 4.  Vasomotor symptoms; FSH 6 9.3 (03/12/2018)  Sicilia presents today for interval colposcopy.  She has history of VIN 3, status post wide local excision with positive margins. Also has history of HSV; currently taking suppressive Valtrex Patient is tobacco user. She is asymptomatic today without having symptoms of vaginal or vulvar burning or discharge. Maneh is menopausal based on [facility] testing; she is no longer taking ERT and is comfortable managing her mild vasomotor symptoms without medication.  Previous colposcopy evaluations: 03/12/2018 vulvar colposcopy with biopsies; findings-symmetric pattern of acetowhite epithelium at the introitus; labia majora normal Biopsies-labia minora, left-benign vulvar tissue with chronic inflammation; labia minora, right-benign vulvar tissue with chronic inflammation 11/24/2015 vulvar colposcopy with biopsies; findings faint acetowhite epithelium along the posterior fourchette and the right medial labia minora;  Biopsy: Posterior fourchette-minimal to mild psoriasiform and spongiotic tissue reaction  Biopsy: Right labia minora, medial aspect-minimal to mild psoriasiform and spongiotic tissue reaction  02/25/2012 same-day surgery wide local excision for CIN-3/CIS of the vulva; preoperative colposcopy showed a well-circumscribed lesion, approximately 1 cm in diameter just to the right of the posterior fourchette there also was a smaller 5 mm lesion seen just to the left of the posterior fourchette, wide local excision, right posterior fourchette lesion: VIN 3 with positive margin Wide local excision, left posterior fourchette lesion: VIN 2 with positive margin  OBJECTIVE: There were no vitals taken for this visit.  Pleasant well-appearing female no acute distress.  Alert and oriented.  Affect is appropriate. Abdomen: Soft, nontender Pelvic exam: External  genitalia-normal BUS-normal Vagina-mild atrophic changes  PROCEDURE: Vulvar colposcopy without biopsies Indications: History of VIN 3, status post wide local excision with positive margins Findings: Faint acetowhite epithelium involving the posterior fourchette and bilateral medial aspects of the labia minora-no significant interval change from previous evaluation Biopsies: None Description: Patient is placed in dorsolithotomy position.  4 x 4 soaked with acetic acid solution are placed onto the perineum for approximately 2 minutes.  Following removal of the 4 x 4 pads, looposcopy is performed with the above-noted findings being noted.  Biopsies were deferred to the patient being asymptomatic at this time, as well as due to the fact that no significant interval change was noted on colposcopy.  Previous biopsies most recently were consistent with benign tissue with chronic inflammation.  ASSESSMENT: 1.  History of VIN 3, status post wide local excision with positive margins back in 2013 2.  History of chronic vulvitis, asymptomatic today 3.  History of HSV-2, on Valtrex suppression 4.  Colposcopy today is stable with minimal acetowhite epithelium  being seen; no biopsies  PLAN: 1.  Vulvar colposcopy as noted 2.  Return in 6 months for annual exam 3.  Return in 1 year for repeat colposcopy or sooner if vulvar symptoms develop including vulvar burning or itching. 4.  Patient is to remain off of ERT at this time.  Brayton Mars, MD  Note: This dictation was prepared with Dragon dictation along with smaller phrase technology. Any transcriptional errors that result from this process are unintentional.

## 2018-10-03 DIAGNOSIS — E079 Disorder of thyroid, unspecified: Secondary | ICD-10-CM | POA: Diagnosis not present

## 2018-10-03 DIAGNOSIS — E782 Mixed hyperlipidemia: Secondary | ICD-10-CM | POA: Diagnosis not present

## 2018-10-03 DIAGNOSIS — R7309 Other abnormal glucose: Secondary | ICD-10-CM | POA: Diagnosis not present

## 2018-10-03 DIAGNOSIS — Z79899 Other long term (current) drug therapy: Secondary | ICD-10-CM | POA: Diagnosis not present

## 2019-03-26 ENCOUNTER — Encounter: Payer: Self-pay | Admitting: Obstetrics and Gynecology

## 2019-03-26 ENCOUNTER — Other Ambulatory Visit: Payer: Self-pay

## 2019-03-26 ENCOUNTER — Ambulatory Visit (INDEPENDENT_AMBULATORY_CARE_PROVIDER_SITE_OTHER): Payer: 59 | Admitting: Obstetrics and Gynecology

## 2019-03-26 VITALS — BP 121/68 | HR 65 | Ht 62.0 in | Wt 131.5 lb

## 2019-03-26 DIAGNOSIS — Z01419 Encounter for gynecological examination (general) (routine) without abnormal findings: Secondary | ICD-10-CM

## 2019-03-26 DIAGNOSIS — N951 Menopausal and female climacteric states: Secondary | ICD-10-CM

## 2019-03-26 DIAGNOSIS — D071 Carcinoma in situ of vulva: Secondary | ICD-10-CM

## 2019-03-26 MED ORDER — ESTRADIOL 1 MG PO TABS: 1.0000 mg | ORAL_TABLET | Freq: Every day | ORAL | 1 refills | Status: AC

## 2019-03-26 NOTE — Progress Notes (Signed)
HPI:      [name] is a 53 y.o. [phone] who LMP was No LMP recorded. Patient has had a hysterectomy.  Subjective:   She presents today for her annual examination.  She generally feels well but she does have regular hot flashes.  She would like to discuss this.  She has having hot flashes for approximately 1 year. A significant note patient has a history of VIN 3    Hx: The following portions of the patient's history were reviewed and updated as appropriate:             She  has a past medical history of Acid reflux, Chronic pelvic pain in female, Colon polyps, Diverticulosis, Dysmenorrhea, Endometriosis, Fibroid, HA (headache), HSV-2 (herpes simplex virus 2) infection, Left flank pain, Limb deformities, congenital, Menopausal state, Panic disorder, Thyroid disease, Tobacco user, Vaginal dryness, and VIN III (vulvar intraepithelial neoplasia III). She does not have any pertinent problems on file. She  has a past surgical history that includes wide local excision (2013); Vaginal hysterectomy; Oophorectomy (Right); Hand surgery (Right); Tubal ligation; Cesarean section; and Esophagogastroduodenoscopy (egd) with propofol (N/A, 12/02/2015). Her family history includes Diabetes in her paternal aunt; Heart disease in her paternal grandfather. She  reports that she has been smoking cigarettes. She has been smoking about 0.50 packs per day. She has never used smokeless tobacco. She reports current alcohol use. She reports that she does not use drugs. She has a current medication list which includes the following prescription(s): ibuprofen, levothyroxine, pantoprazole, ranitidine, sucralfate, and valacyclovir. She is allergic to flagyl [metronidazole]; iodinated diagnostic agents; and sulfa drugs cross reactors.       Review of Systems:  Review of Systems  Constitutional: Denied constitutional symptoms, night sweats, recent illness, fatigue, fever, insomnia and weight loss.  Eyes: Denied eye  symptoms, eye pain, photophobia, vision change and visual disturbance.  Ears/Nose/Throat/Neck: Denied ear, nose, throat or neck symptoms, hearing loss, nasal discharge, sinus congestion and sore throat.  Cardiovascular: Denied cardiovascular symptoms, arrhythmia, chest pain/pressure, edema, exercise intolerance, orthopnea and palpitations.  Respiratory: Denied pulmonary symptoms, asthma, pleuritic pain, productive sputum, cough, dyspnea and wheezing.  Gastrointestinal: Denied, gastro-esophageal reflux, melena, nausea and vomiting.  Genitourinary: See HPI for additional information.  Musculoskeletal: Denied musculoskeletal symptoms, stiffness, swelling, muscle weakness and myalgia.  Dermatologic: Denied dermatology symptoms, rash and scar.  Neurologic: Denied neurology symptoms, dizziness, headache, neck pain and syncope.  Psychiatric: Denied psychiatric symptoms, anxiety and depression.  Endocrine: Denied endocrine symptoms including hot flashes and night sweats.   Meds:   Current Outpatient Medications on File Prior to Visit  Medication Sig Dispense Refill  . ibuprofen (ADVIL,MOTRIN) 800 MG tablet Take 800 mg by mouth every 8 (eight) hours as needed.      Marland Kitchen levothyroxine (SYNTHROID) 150 MCG tablet     . pantoprazole (PROTONIX) 40 MG tablet     . ranitidine (ZANTAC) 150 MG tablet Take by mouth.    . sucralfate (CARAFATE) 1 g tablet Take 1 g by mouth 4 (four) times daily -  with meals and at bedtime.    . valACYclovir (VALTREX) 1000 MG tablet Take 1 tablet (1,000 mg total) by mouth 2 (two) times daily. 60 tablet 3   No current facility-administered medications on file prior to visit.     Objective:     Vitals:   03/26/19 0841  BP: 121/68  Pulse: 65              Physical examination General  NAD, Conversant  HEENT Atraumatic; Op clear with mmm.  Normo-cephalic. Pupils reactive. Anicteric sclerae  Thyroid/Neck Smooth without nodularity or enlargement. Normal ROM.  Neck Supple.   Skin No rashes, lesions or ulceration. Normal palpated skin turgor. No nodularity.  Breasts: No masses or discharge.  Symmetric.  No axillary adenopathy.  Lungs: Clear to auscultation.No rales or wheezes. Normal Respiratory effort, no retractions.  Heart: NSR.  No murmurs or rubs appreciated. No periferal edema  Abdomen: Soft.  Non-tender.  No masses.  No HSM. No hernia  Extremities: Moves all appropriately.  Normal ROM for age. No lymphadenopathy.  Neuro: Oriented to PPT.  Normal mood. Normal affect.     Pelvic:   Vulva: Normal appearance.  No lesions.  No evidence of significant change or sign of VIN 3 at this time.  Vagina: No lesions or abnormalities noted.  Support: Normal pelvic support.  Urethra No masses tenderness or scarring.  Meatus Normal size without lesions or prolapse.  Cervix:  Surgically absent  Anus: Normal exam.  No lesions.  Perineum: Normal exam.  No lesions.        Bimanual   Uterus:  Surgically absent  Adnexae: No masses.  Non-tender to palpation.  Cul-de-sac: Negative for abnormality.      Assessment:    [id number] Patient Active Problem List   Diagnosis Date Noted  . Menopausal symptom 03/25/2018  . 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 Status post hysterectomy 11/10/2015  . 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/10/2015  . Chronic abdominal pain 05/03/2015  . Chronic headache 05/03/2015  . Colon polyp 05/03/2015  . DD (diverticular disease) 05/03/2015  . Deformity of hand 05/03/2015  . Genital herpes 05/03/2015  . Episodic paroxysmal anxiety disorder 05/03/2015  . Disease of thyroid gland 05/03/2015  . Compulsive tobacco user syndrome 05/03/2015  . Congenital limb deformity 07/16/2011  . Hypothyroidism 07/16/2011     1. Well woman exam with routine gynecological exam   2. VIN III (vulvar intraepithelial neoplasia III)   3. Symptomatic menopausal or female climacteric states        Plan:            1.  Basic Screening  Recommendations The basic screening recommendations for asymptomatic women were discussed with the patient during her visit.  The age-appropriate recommendations were discussed with her and the rational for the tests reviewed.  When I am informed by the patient that another primary care physician has previously obtained the age-appropriate tests and they are up-to-date, only outstanding tests are ordered and referrals given as necessary.  Abnormal results of tests will be discussed with her when all of her results are completed. Lab work and mammogram performed by PCP. 2.  Patient would like to start ERT.   HRT I have discussed HRT with the patient in detail.  The risk/benefits of it were reviewed.  She understands that during menopause Estrogen decreases dramatically and that this results in an increased risk of cardiovascular disease as well as osteoporosis.  We have also discussed the fact that hot flashes often result from a decrease in Estrogen, and that by replacing Estrogen, they can often be alleviated.  We have discussed skin, vaginal and urinary tract changes that may also take place from this drop in Estrogen.  Emotional changes have also been linked to Estrogen and we have briefly discussed this.  The benefits of HRT including decrease in hot flashes, vaginal dryness, and osteoporosis were discussed.  The emotional benefit and a possible change  in her cardiovascular risk profile was also reviewed.  The risks associated with Hormone Replacement Therapy were also reviewed.  The use of unopposed Estrogen and its relationship to endometrial cancer was discussed.  The addition of Progesterone and its beneficial effect on endometrial cancer was also noted.  The fact that there has been no consistent definitive studies showing an increase in breast cancer in women who use HRT was discussed with the patient.  The possible side effects including breast tenderness, fluid retention, mood changes and vaginal  bleeding were discussed.  The patient was informed that this is an elective medication and that she may choose not to take Hormone Replacement Therapy.  Literature on HRT was given, and I believe that after answering all of the patient's questions, she has an adequate and informed understanding of HRT.  Special emphasis on the WHI study, as well as several studies since that pertaining to the risks and benefits of estrogen replacement therapy were compared.  The possible limitations of these studies were discussed including the age stratification of the WHI study.  The possible role of Progesterone in these studies was discussed in detail.  I believe that the patient has an informed knowledge of the risks and benefits of HRT.  I have specifically discussed WHI findings and current updates.  Different type of hormone formulation and methods of taking hormone replacement therapy discussed.   Orders No orders of the defined types were placed in this encounter.   No orders of the defined types were placed in this encounter.       F/U  Return in about 3 months (around 06/26/2019).  Finis Bud, M.D. 03/26/2019 9:07 AM

## 2019-03-26 NOTE — Progress Notes (Signed)
Patient comes in today for well woman exam. She has no concerns today. PCP orders labs and mammogram. Patient has had a hysterectomy.

## 2019-07-01 ENCOUNTER — Encounter: Payer: Self-pay | Admitting: Obstetrics and Gynecology

## 2019-08-27 ENCOUNTER — Telehealth: Payer: Self-pay

## 2019-08-27 NOTE — Telephone Encounter (Signed)
Scheduled patient to come in tomorrow 

## 2019-08-27 NOTE — Telephone Encounter (Signed)
Burning internal and odor. Pt would like to be seen today.

## 2019-08-28 ENCOUNTER — Other Ambulatory Visit: Payer: Self-pay

## 2019-08-28 ENCOUNTER — Encounter: Payer: Self-pay | Admitting: Obstetrics and Gynecology

## 2019-08-28 ENCOUNTER — Ambulatory Visit: Payer: Managed Care, Other (non HMO) | Admitting: Obstetrics and Gynecology

## 2019-08-28 VITALS — BP 103/72 | HR 63 | Ht 62.0 in | Wt 138.3 lb

## 2019-08-28 DIAGNOSIS — R300 Dysuria: Secondary | ICD-10-CM | POA: Diagnosis not present

## 2019-08-28 DIAGNOSIS — B9689 Other specified bacterial agents as the cause of diseases classified elsewhere: Secondary | ICD-10-CM

## 2019-08-28 DIAGNOSIS — N760 Acute vaginitis: Secondary | ICD-10-CM

## 2019-08-28 LAB — POCT URINALYSIS DIPSTICK OB
Bilirubin, UA: NEGATIVE
Glucose, UA: NEGATIVE
Ketones, UA: NEGATIVE
Leukocytes, UA: NEGATIVE
Nitrite, UA: NEGATIVE
POC,PROTEIN,UA: NEGATIVE
Spec Grav, UA: 1.01 (ref 1.010–1.025)
Urobilinogen, UA: 0.2 E.U./dL
pH, UA: 6.5 (ref 5.0–8.0)

## 2019-08-28 MED ORDER — CLINDAMYCIN HCL 300 MG PO CAPS: 300.0000 mg | ORAL_CAPSULE | Freq: Two times a day (BID) | ORAL | 0 refills | Status: AC

## 2019-08-28 NOTE — Addendum Note (Signed)
Addended by: Durwin Glaze on: 08/28/2019 10:12 AM   Modules accepted: Orders

## 2019-08-28 NOTE — Progress Notes (Signed)
HPI:      [name] is a 53 y.o. [phone] who LMP was No LMP recorded. Patient has had a hysterectomy.  Subjective:   She presents today with complaint of several day history of increased vaginal discharge, odor, vulvar burning.  Patient states that she is very sensitive and often has vulvar burning if she is not very careful with her hygiene soap etc.  She says she has not been vigilant regarding the type of soap she uses lately.    Hx: The following portions of the patient's history were reviewed and updated as appropriate:             She  has a past medical history of Acid reflux, Chronic pelvic pain in female, Colon polyps, Diverticulosis, Dysmenorrhea, Endometriosis, Fibroid, HA (headache), HSV-2 (herpes simplex virus 2) infection, Left flank pain, Limb deformities, congenital, Menopausal state, Panic disorder, Thyroid disease, Tobacco user, Vaginal dryness, and VIN III (vulvar intraepithelial neoplasia III). She does not have any pertinent problems on file. She  has a past surgical history that includes wide local excision (2013); Vaginal hysterectomy; Oophorectomy (Right); Hand surgery (Right); Tubal ligation; Cesarean section; and Esophagogastroduodenoscopy (egd) with propofol (N/A, 12/02/2015). Her family history includes Diabetes in her paternal aunt; Heart disease in her paternal grandfather. She  reports that she has been smoking cigarettes. She has been smoking about 0.50 packs per day. She has never used smokeless tobacco. She reports current alcohol use. She reports that she does not use drugs. She has a current medication list which includes the following prescription(s): ibuprofen, levothyroxine, pantoprazole, sucralfate, valacyclovir, clindamycin, and estradiol. She is allergic to flagyl [metronidazole]; iodinated diagnostic agents; and sulfa drugs cross reactors.       Review of Systems:  Review of Systems  Constitutional: Denied constitutional symptoms, night  sweats, recent illness, fatigue, fever, insomnia and weight loss.  Eyes: Denied eye symptoms, eye pain, photophobia, vision change and visual disturbance.  Ears/Nose/Throat/Neck: Denied ear, nose, throat or neck symptoms, hearing loss, nasal discharge, sinus congestion and sore throat.  Cardiovascular: Denied cardiovascular symptoms, arrhythmia, chest pain/pressure, edema, exercise intolerance, orthopnea and palpitations.  Respiratory: Denied pulmonary symptoms, asthma, pleuritic pain, productive sputum, cough, dyspnea and wheezing.  Gastrointestinal: Denied, gastro-esophageal reflux, melena, nausea and vomiting.  Genitourinary: See HPI for additional information.  Musculoskeletal: Denied musculoskeletal symptoms, stiffness, swelling, muscle weakness and myalgia.  Dermatologic: Denied dermatology symptoms, rash and scar.  Neurologic: Denied neurology symptoms, dizziness, headache, neck pain and syncope.  Psychiatric: Denied psychiatric symptoms, anxiety and depression.  Endocrine: Denied endocrine symptoms including hot flashes and night sweats.   Meds:   Current Outpatient Medications on File Prior to Visit  Medication Sig Dispense Refill  . ibuprofen (ADVIL,MOTRIN) 800 MG tablet Take 800 mg by mouth every 8 (eight) hours as needed.      Marland Kitchen levothyroxine (SYNTHROID) 150 MCG tablet     . pantoprazole (PROTONIX) 40 MG tablet     . sucralfate (CARAFATE) 1 g tablet Take 1 g by mouth 4 (four) times daily -  with meals and at bedtime.    . valACYclovir (VALTREX) 1000 MG tablet Take 1 tablet (1,000 mg total) by mouth 2 (two) times daily. 60 tablet 3  . estradiol (ESTRACE) 1 MG tablet Take 1 tablet (1 mg total) by mouth daily. (Patient not taking: Reported on 08/28/2019) 90 tablet 1   No current facility-administered medications on file prior to visit.     Objective:     Vitals:  08/28/19 0916  BP: 103/72  Pulse: 63              Physical examination   Pelvic:   Vulva: Normal appearance.   No lesions.  Vagina: No lesions or abnormalities noted.  Support: Normal pelvic support.  Urethra No masses tenderness or scarring.  Meatus Normal size without lesions or prolapse.  Cervix: Normal appearance.  No lesions.  Anus: Normal exam.  No lesions.  Perineum: Normal exam.  No lesions.        Bimanual   Uterus: Normal size.  Non-tender.  Mobile.  AV.  Adnexae: No masses.  Non-tender to palpation.  Cul-de-sac: Negative for abnormality.   WET PREP: clue cells: present, KOH (yeast): negative, odor: present and trichomoniasis: negative Ph:  > 4.5   Assessment:    [id number] Patient Active Problem List   Diagnosis Date Noted  . Menopausal symptom 03/25/2018  . 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 Status post hysterectomy 11/10/2015  . 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/10/2015  . Chronic abdominal pain 05/03/2015  . Chronic headache 05/03/2015  . Colon polyp 05/03/2015  . DD (diverticular disease) 05/03/2015  . Deformity of hand 05/03/2015  . Genital herpes 05/03/2015  . Episodic paroxysmal anxiety disorder 05/03/2015  . Disease of thyroid gland 05/03/2015  . Compulsive tobacco user syndrome 05/03/2015  . Congenital limb deformity 07/16/2011  . Hypothyroidism 07/16/2011     1. Dysuria   2. Bacterial vulvovaginitis     In addition it is possible that the BV is causing more irritation for her than usual.   Plan:            1.  Clindamycin for BV as patient reports anaphylactic allergy to Flagyl  2.  Increased attention to soaps and hygiene measures (patient has plans to do this without my intervention)  3.  Urine for culture Orders Orders Placed This Encounter  Procedures  . POC Urinalysis Dipstick OB     Meds ordered this encounter  Medications  . clindamycin (CLEOCIN) 300 MG capsule    Sig: Take 1 capsule (300 mg total) by mouth 2 (two) times daily for 7 days.    Dispense:  14 capsule    Refill:  0      F/U  Return for Annual Physical, Pt  to contact us if symptoms worsen. I spent 18 minutes involved in the care of this patient of which greater than 50% was spent discussing BV, vaginal discharge odor versus yeast infection, versus atopic dermatitis from soaps, perfume etc.  All questions answered.  Finis Bud, M.D. 08/28/2019 9:53 AM

## 2019-08-30 LAB — URINE CULTURE: Organism ID, Bacteria: NO GROWTH

## 2019-09-01 ENCOUNTER — Telehealth: Payer: Self-pay | Admitting: Obstetrics and Gynecology

## 2019-09-01 NOTE — Telephone Encounter (Signed)
LM for patient to return call.

## 2019-09-01 NOTE — Telephone Encounter (Signed)
The patient stated that she was treated for a bacterial infection last Friday and her medication that was prescribed did not help her at all. Pt is requesting a call back from her nurse. Please advise.

## 2019-09-01 NOTE — Telephone Encounter (Signed)
Pt returned call to provider.

## 2019-09-01 NOTE — Telephone Encounter (Signed)
Spoke with patient and she stated that does not feel clean down in her private area. She doe snot feel that the clindamycin is working. She also wanted you to know she did have a one night stand about a month ago with unprotected sex. Please advise if you want to change her medication to something else.

## 2019-09-02 ENCOUNTER — Encounter: Payer: Self-pay | Admitting: Obstetrics and Gynecology

## 2019-09-02 ENCOUNTER — Ambulatory Visit (INDEPENDENT_AMBULATORY_CARE_PROVIDER_SITE_OTHER): Payer: Managed Care, Other (non HMO) | Admitting: Obstetrics and Gynecology

## 2019-09-02 ENCOUNTER — Other Ambulatory Visit: Payer: Self-pay

## 2019-09-02 DIAGNOSIS — Z202 Contact with and (suspected) exposure to infections with a predominantly sexual mode of transmission: Secondary | ICD-10-CM

## 2019-09-02 NOTE — Progress Notes (Signed)
Patient dropped of urine sample for GC testing. New sex partner.

## 2019-09-02 NOTE — Telephone Encounter (Signed)
Scheduled patient to come in to drop off urine sample.

## 2019-09-04 LAB — GC/CHLAMYDIA PROBE AMP
Chlamydia trachomatis, NAA: NEGATIVE
Neisseria Gonorrhoeae by PCR: NEGATIVE

## 2019-09-10 ENCOUNTER — Encounter: Payer: Self-pay | Admitting: Surgical

## 2019-10-26 ENCOUNTER — Other Ambulatory Visit: Payer: Self-pay | Admitting: Internal Medicine

## 2019-10-26 DIAGNOSIS — Z1231 Encounter for screening mammogram for malignant neoplasm of breast: Secondary | ICD-10-CM

## 2019-10-26 DIAGNOSIS — R05 Cough: Secondary | ICD-10-CM

## 2019-10-26 DIAGNOSIS — R053 Chronic cough: Secondary | ICD-10-CM

## 2019-10-26 DIAGNOSIS — R1310 Dysphagia, unspecified: Secondary | ICD-10-CM

## 2019-11-11 ENCOUNTER — Ambulatory Visit
Admission: RE | Admit: 2019-11-11 | Discharge: 2019-11-11 | Disposition: A | Discharge status: Home / Self Care | Payer: Managed Care, Other (non HMO) | Source: Ambulatory Visit | Attending: Internal Medicine | Admitting: Internal Medicine

## 2019-11-11 ENCOUNTER — Other Ambulatory Visit: Payer: Self-pay

## 2019-11-11 DIAGNOSIS — R053 Chronic cough: Secondary | ICD-10-CM

## 2019-11-11 DIAGNOSIS — R05 Cough: Secondary | ICD-10-CM | POA: Insufficient documentation

## 2019-11-11 DIAGNOSIS — R1310 Dysphagia, unspecified: Secondary | ICD-10-CM | POA: Insufficient documentation

## 2020-01-09 ENCOUNTER — Ambulatory Visit: Payer: Managed Care, Other (non HMO)

## 2020-01-09 ENCOUNTER — Ambulatory Visit: Payer: Self-pay | Attending: Internal Medicine

## 2020-01-09 DIAGNOSIS — Z23 Encounter for immunization: Secondary | ICD-10-CM

## 2020-01-09 NOTE — Progress Notes (Signed)
   Covid-19 Vaccination Clinic  Name:  Kayla Sellers    MRN: [id number] DOB: 1966/03/13  01/09/2020  [name] was observed post Covid-19 immunization for 15 minutes without incident. She was provided with Vaccine Information Sheet and instruction to access the V-Safe system.   [name] was instructed to call 911 with any severe reactions post vaccine: Marland Kitchen Difficulty breathing  . Swelling of face and throat  . A fast heartbeat  . A bad rash all over body  . Dizziness and weakness   Immunizations Administered    Name Date Dose VIS Date Route   Pfizer COVID-19 Vaccine 01/09/2020  9:40 AM 0.3 mL 10/02/2019 Intramuscular   Manufacturer: [place]   Lot: [id number]   [place]: [id number]

## 2020-02-02 ENCOUNTER — Ambulatory Visit: Payer: Managed Care, Other (non HMO) | Attending: Internal Medicine

## 2020-02-02 DIAGNOSIS — Z23 Encounter for immunization: Secondary | ICD-10-CM

## 2020-02-02 NOTE — Progress Notes (Signed)
   Covid-19 Vaccination Clinic  Name:  Kayla Sellers    MRN: [id number] DOB: 1965-11-03  02/02/2020  [name] was observed post Covid-19 immunization for 15 minutes without incident. She was provided with Vaccine Information Sheet and instruction to access the V-Safe system.   [name] was instructed to call 911 with any severe reactions post vaccine: Marland Kitchen Difficulty breathing  . Swelling of face and throat  . A fast heartbeat  . A bad rash all over body  . Dizziness and weakness   Immunizations Administered    Name Date Dose VIS Date Route   Pfizer COVID-19 Vaccine 02/02/2020  4:22 PM 0.3 mL 10/02/2019 Intramuscular   Manufacturer: [place]   Lot: [id number]   [place]: [id number]

## 2020-06-14 ENCOUNTER — Encounter: Payer: Self-pay | Admitting: Otolaryngology

## 2020-06-17 ENCOUNTER — Other Ambulatory Visit: Payer: Self-pay

## 2020-06-17 ENCOUNTER — Other Ambulatory Visit
Admission: RE | Admit: 2020-06-17 | Discharge: 2020-06-17 | Disposition: A | Discharge status: Home / Self Care | Payer: Managed Care, Other (non HMO) | Source: Ambulatory Visit | Attending: Otolaryngology | Admitting: Otolaryngology

## 2020-06-17 DIAGNOSIS — Z20822 Contact with and (suspected) exposure to covid-19: Secondary | ICD-10-CM | POA: Diagnosis not present

## 2020-06-17 DIAGNOSIS — Z01812 Encounter for preprocedural laboratory examination: Secondary | ICD-10-CM | POA: Diagnosis present

## 2020-06-17 LAB — SARS CORONAVIRUS 2 (TAT 6-24 HRS): SARS Coronavirus 2: NEGATIVE

## 2020-06-20 NOTE — Discharge Instructions (Signed)
General Anesthesia, Adult, Care After This sheet gives you information about how to care for yourself after your procedure. Your health care provider may also give you more specific instructions. If you have problems or questions, contact your health care provider. What can I expect after the procedure? After the procedure, the following side effects are common:  Pain or discomfort at the IV site.  Nausea.  Vomiting.  Sore throat.  Trouble concentrating.  Feeling cold or chills.  Weak or tired.  Sleepiness and fatigue.  Soreness and body aches. These side effects can affect parts of the body that were not involved in surgery. Follow these instructions at home:  For at least 24 hours after the procedure:  Have a responsible adult stay with you. It is important to have someone help care for you until you are awake and alert.  Rest as needed.  Do not: ? Participate in activities in which you could fall or become injured. ? Drive. ? Use heavy machinery. ? Drink alcohol. ? Take sleeping pills or medicines that cause drowsiness. ? Make important decisions or sign legal documents. ? Take care of children on your own. Eating and drinking  Follow any instructions from your health care provider about eating or drinking restrictions.  When you feel hungry, start by eating small amounts of foods that are soft and easy to digest (bland), such as toast. Gradually return to your regular diet.  Drink enough fluid to keep your urine pale yellow.  If you vomit, rehydrate by drinking water, juice, or clear broth. General instructions  If you have sleep apnea, surgery and certain medicines can increase your risk for breathing problems. Follow instructions from your health care provider about wearing your sleep device: ? Anytime you are sleeping, including during daytime naps. ? While taking prescription pain medicines, sleeping medicines, or medicines that make you drowsy.  Return to  your normal activities as told by your health care provider. Ask your health care provider what activities are safe for you.  Take over-the-counter and prescription medicines only as told by your health care provider.  If you smoke, do not smoke without supervision.  Keep all follow-up visits as told by your health care provider. This is important. Contact a health care provider if:  You have nausea or vomiting that does not get better with medicine.  You cannot eat or drink without vomiting.  You have pain that does not get better with medicine.  You are unable to pass urine.  You develop a skin rash.  You have a fever.  You have redness around your IV site that gets worse. Get help right away if:  You have difficulty breathing.  You have chest pain.  You have blood in your urine or stool, or you vomit blood. Summary  After the procedure, it is common to have a sore throat or nausea. It is also common to feel tired.  Have a responsible adult stay with you for the first 24 hours after general anesthesia. It is important to have someone help care for you until you are awake and alert.  When you feel hungry, start by eating small amounts of foods that are soft and easy to digest (bland), such as toast. Gradually return to your regular diet.  Drink enough fluid to keep your urine pale yellow.  Return to your normal activities as told by your health care provider. Ask your health care provider what activities are safe for you. This information is not   intended to replace advice given to you by your health care provider. Make sure you discuss any questions you have with your health care provider. Document Revised: 10/11/2017 Document Reviewed: 05/24/2017 Elsevier Patient Education  2020 [employer]  

## 2020-06-21 ENCOUNTER — Ambulatory Visit: Payer: Managed Care, Other (non HMO) | Admitting: Anesthesiology

## 2020-06-21 ENCOUNTER — Other Ambulatory Visit: Payer: Self-pay

## 2020-06-21 ENCOUNTER — Encounter
Admission: RE | Disposition: A | Discharge status: Home / Self Care | Payer: Self-pay | Source: Home / Self Care | Attending: Otolaryngology

## 2020-06-21 ENCOUNTER — Encounter: Payer: Self-pay | Admitting: Otolaryngology

## 2020-06-21 ENCOUNTER — Ambulatory Visit
Admission: RE | Admit: 2020-06-21 | Discharge: 2020-06-21 | Disposition: A | Discharge status: Home / Self Care | Payer: Managed Care, Other (non HMO) | Attending: Otolaryngology | Admitting: Otolaryngology

## 2020-06-21 DIAGNOSIS — L720 Epidermal cyst: Secondary | ICD-10-CM | POA: Insufficient documentation

## 2020-06-21 DIAGNOSIS — K219 Gastro-esophageal reflux disease without esophagitis: Secondary | ICD-10-CM | POA: Diagnosis not present

## 2020-06-21 DIAGNOSIS — Z79890 Hormone replacement therapy (postmenopausal): Secondary | ICD-10-CM | POA: Insufficient documentation

## 2020-06-21 DIAGNOSIS — I10 Essential (primary) hypertension: Secondary | ICD-10-CM | POA: Insufficient documentation

## 2020-06-21 DIAGNOSIS — Z79899 Other long term (current) drug therapy: Secondary | ICD-10-CM | POA: Diagnosis not present

## 2020-06-21 DIAGNOSIS — L723 Sebaceous cyst: Secondary | ICD-10-CM | POA: Diagnosis present

## 2020-06-21 DIAGNOSIS — F17210 Nicotine dependence, cigarettes, uncomplicated: Secondary | ICD-10-CM | POA: Insufficient documentation

## 2020-06-21 DIAGNOSIS — E039 Hypothyroidism, unspecified: Secondary | ICD-10-CM | POA: Diagnosis not present

## 2020-06-21 HISTORY — PX: LESION REMOVAL: SHX5196

## 2020-06-21 HISTORY — DX: Nausea with vomiting, unspecified: R11.2

## 2020-06-21 HISTORY — DX: Other specified postprocedural states: Z98.890

## 2020-06-21 SURGERY — MINOR EXCISION OF LESION
Anesthesia: General | Site: Neck | Laterality: Left

## 2020-06-21 MED ORDER — PROPOFOL 10 MG/ML IV BOLUS
INTRAVENOUS | Status: DC | PRN
  Administered 2020-06-21: 200 mg via INTRAVENOUS

## 2020-06-21 MED ORDER — LIDOCAINE HCL (CARDIAC) PF 100 MG/5ML IV SOSY
PREFILLED_SYRINGE | INTRAVENOUS | Status: DC | PRN
  Administered 2020-06-21: 40 mg via INTRATRACHEAL

## 2020-06-21 MED ORDER — FENTANYL CITRATE (PF) 100 MCG/2ML IJ SOLN: 25.0000 ug | INTRAMUSCULAR | Status: DC | PRN

## 2020-06-21 MED ORDER — LACTATED RINGERS IV SOLN: INTRAVENOUS | Status: DC

## 2020-06-21 MED ORDER — LIDOCAINE-EPINEPHRINE 1 %-1:100000 IJ SOLN
INTRAMUSCULAR | Status: DC | PRN
  Administered 2020-06-21: 4 mL

## 2020-06-21 MED ORDER — GLYCOPYRROLATE 0.2 MG/ML IJ SOLN
INTRAMUSCULAR | Status: DC | PRN
  Administered 2020-06-21: .1 mg via INTRAVENOUS

## 2020-06-21 MED ORDER — OXYCODONE HCL 5 MG/5ML PO SOLN: 5.0000 mg | Freq: Once | ORAL | Status: DC | PRN

## 2020-06-21 MED ORDER — FENTANYL CITRATE (PF) 100 MCG/2ML IJ SOLN
INTRAMUSCULAR | Status: DC | PRN
Start: 2020-06-21 — End: 2020-06-21
  Administered 2020-06-21 (×2): 25 ug via INTRAVENOUS

## 2020-06-21 MED ORDER — ACETAMINOPHEN 160 MG/5ML PO SOLN: 325.0000 mg | ORAL | Status: DC | PRN

## 2020-06-21 MED ORDER — MIDAZOLAM HCL 5 MG/5ML IJ SOLN
INTRAMUSCULAR | Status: DC | PRN
  Administered 2020-06-21: 2 mg via INTRAVENOUS

## 2020-06-21 MED ORDER — ONDANSETRON HCL 4 MG/2ML IJ SOLN
INTRAMUSCULAR | Status: DC | PRN
  Administered 2020-06-21: 4 mg via INTRAVENOUS

## 2020-06-21 MED ORDER — BACITRACIN 500 UNIT/GM EX OINT
TOPICAL_OINTMENT | CUTANEOUS | Status: DC | PRN
  Administered 2020-06-21: 1 via TOPICAL

## 2020-06-21 MED ORDER — OXYCODONE HCL 5 MG PO TABS: 5.0000 mg | ORAL_TABLET | Freq: Once | ORAL | Status: DC | PRN

## 2020-06-21 MED ORDER — ONDANSETRON HCL 4 MG/2ML IJ SOLN: 4.0000 mg | Freq: Once | INTRAMUSCULAR | Status: DC | PRN

## 2020-06-21 MED ORDER — EPHEDRINE SULFATE 50 MG/ML IJ SOLN
INTRAMUSCULAR | Status: DC | PRN
  Administered 2020-06-21 (×2): 10 mg via INTRAVENOUS

## 2020-06-21 MED ORDER — ACETAMINOPHEN 325 MG PO TABS: 325.0000 mg | ORAL_TABLET | ORAL | Status: DC | PRN

## 2020-06-21 MED ORDER — DEXAMETHASONE SODIUM PHOSPHATE 4 MG/ML IJ SOLN
INTRAMUSCULAR | Status: DC | PRN
  Administered 2020-06-21: 4 mg via INTRAVENOUS

## 2020-06-21 SURGICAL SUPPLY — 14 items
CANISTER SUCT 1200ML W/VALVE (MISCELLANEOUS) ×3 IMPLANT
CORD BIP STRL DISP 12FT (MISCELLANEOUS) ×3 IMPLANT
DECANTER SPIKE VIAL GLASS SM (MISCELLANEOUS) ×3 IMPLANT
ELECT REM PT RETURN 9FT ADLT (ELECTROSURGICAL) ×3
ELECTRODE REM PT RTRN 9FT ADLT (ELECTROSURGICAL) ×1 IMPLANT
GLOVE BIO SURGEON STRL SZ7.5 (GLOVE) ×3 IMPLANT
GOWN STRL REUS W/ TWL LRG LVL3 (GOWN DISPOSABLE) ×2 IMPLANT
GOWN STRL REUS W/TWL LRG LVL3 (GOWN DISPOSABLE) ×6
KIT TURNOVER KIT A (KITS) ×3 IMPLANT
MARKER SKIN DUAL TIP RULER LAB (MISCELLANEOUS) ×2 IMPLANT
NS IRRIG 500ML POUR BTL (IV SOLUTION) ×3 IMPLANT
PACK ENT CUSTOM (PACKS) ×3 IMPLANT
SUT PROLENE 5 0 PS 3 (SUTURE) ×2 IMPLANT
SUT VICRYL 5-0 (SUTURE) ×2 IMPLANT

## 2020-06-21 NOTE — Op Note (Signed)
06/21/2020  8:08 AM    Janalynn Luster Landsberg  [id number]   Pre-Op Diagnosis:  Left neck sebaceous cyst  Post-op Diagnosis: Same  Procedure:   Excision of left neck sebaceous cyst with intermediate closure (2 cm)  Surgeon:  Riley Nearing  Anesthesia:  General endotracheal  EBL:  minimal  Complications:  None  Findings: ruptured subcutaneous cyst  Procedure: After the patient was identified in holding and the procedure was reviewed.  The patient was taken to the operating room and with the patient in a comfortable supine position,  general orotracheal anesthesia was induced without difficulty.  A proper time-out was performed.  The skin around the cyst was infiltrated with 1% lidocaine with epiniephrine, 1:100,000. The area was then prepped and draped in the usual sterile fashion.   The skin was then incised in an ellipse around the cyst with a 15 blade, taking care not to enter the wall of the cyst. The cyst was then carefully dissected with tenotomies subcutaneously, dividing soft tissue attachments to the cyst without violating the cyst wall, carefully dissecting circumferentially around the cyst. A margin of normal subcutaneous fat was removed to insure complete cyst excision. Bipolar cautery was used as needed to control minor bleeding. The dead space from the cyst was closed with 5-0 Vicryl suture to reduce risk of hematoma formation. The subcutaneous tissues were closed with 5-0 Vicryl, and the skin closed with 5-0 Prolene in a running locked stitch. Bacitracin ointment was applied, followed by dressing. The patient was then returned to the anesthesiologist in good condition. The patient was then extubated and taken to the recovery room in good condition.  Disposition:   PACU to home  Plan: Dressing to be removed tomorrow, followed by applying antibiotic ointment to the wound twice daily. Tylenol may be used as needed for pain. Ice to area PRN for swelling. Follow-up in 1  week.   Riley Nearing 06/21/2020 8:08 AM

## 2020-06-21 NOTE — H&P (Signed)
History and physical reviewed and will be scanned in later. No change in medical status reported by the patient or family, appears stable for surgery. All questions regarding the procedure answered, and patient (or family if a child) expressed understanding of the procedure. ? ?Kayla Sellers S Kayla Sellers ?@[email or web address]@ ?

## 2020-06-21 NOTE — Anesthesia Preprocedure Evaluation (Signed)
Anesthesia Evaluation  Patient identified by MRN, date of birth, ID band Patient awake    Reviewed: Allergy & Precautions, H&P , NPO status , Patient's Chart, lab work & pertinent test results  Airway Mallampati: II  TM Distance: >3 FB Neck ROM: full    Dental no notable dental hx.    Pulmonary Current SmokerPatient did not abstain from smoking.,    Pulmonary exam normal breath sounds clear to auscultation       Cardiovascular hypertension, Normal cardiovascular exam Rhythm:regular Rate:Normal     Neuro/Psych PSYCHIATRIC DISORDERS    GI/Hepatic GERD  ,  Endo/Other  Hypothyroidism   Renal/GU      Musculoskeletal   Abdominal   Peds  Hematology   Anesthesia Other Findings   Reproductive/Obstetrics                             Anesthesia Physical Anesthesia Plan  ASA: II  Anesthesia Plan: General LMA   Post-op Pain Management:    Induction:   PONV Risk Score and Plan: 2 and Treatment may vary due to age or medical condition, Ondansetron and Dexamethasone  Airway Management Planned:   Additional Equipment:   Intra-op Plan:   Post-operative Plan:   Informed Consent: I have reviewed the patients History and Physical, chart, labs and discussed the procedure including the risks, benefits and alternatives for the proposed anesthesia with the patient or authorized representative who has indicated his/her understanding and acceptance.     Dental Advisory Given  Plan Discussed with: CRNA  Anesthesia Plan Comments:         Anesthesia Quick Evaluation

## 2020-06-21 NOTE — Anesthesia Procedure Notes (Signed)
Procedure Name: LMA Insertion Date/Time: 06/21/2020 7:33 AM Performed by: Cameron Ali, CRNA Pre-anesthesia Checklist: Patient identified, Emergency Drugs available, Suction available, Timeout performed and Patient being monitored Patient Re-evaluated:Patient Re-evaluated prior to induction Oxygen Delivery Method: Circle system utilized Preoxygenation: Pre-oxygenation with 100% oxygen Induction Type: IV induction LMA: LMA inserted LMA Size: 4.0 Number of attempts: 1 Placement Confirmation: positive ETCO2 and breath sounds checked- equal and bilateral Tube secured with: Tape Dental Injury: Teeth and Oropharynx as per pre-operative assessment

## 2020-06-21 NOTE — Transfer of Care (Signed)
Immediate Anesthesia Transfer of Care Note  Patient: Kayla Sellers  Procedure(s) Performed: EXCISION OF NECK CYST (Left Neck)  Patient Location: PACU  Anesthesia Type: General LMA  Level of Consciousness: awake, alert  and patient cooperative  Airway and Oxygen Therapy: Patient Spontanous Breathing and Patient connected to supplemental oxygen  Post-op Assessment: Post-op Vital signs reviewed, Patient's Cardiovascular Status Stable, Respiratory Function Stable, Patent Airway and No signs of Nausea or vomiting  Post-op Vital Signs: Reviewed and stable  Complications: No complications documented.

## 2020-06-21 NOTE — Anesthesia Postprocedure Evaluation (Signed)
Anesthesia Post Note  Patient: Kayla Sellers  Procedure(s) Performed: EXCISION OF NECK CYST (Left Neck)     Patient location during evaluation: PACU Anesthesia Type: General Level of consciousness: awake and alert and oriented Pain management: satisfactory to patient Vital Signs Assessment: post-procedure vital signs reviewed and stable Respiratory status: spontaneous breathing, nonlabored ventilation and respiratory function stable Cardiovascular status: blood pressure returned to baseline and stable Postop Assessment: Adequate PO intake and No signs of nausea or vomiting Anesthetic complications: no   No complications documented.  Raliegh Ip

## 2020-06-23 LAB — SURGICAL PATHOLOGY

## 2020-09-21 ENCOUNTER — Other Ambulatory Visit: Payer: Self-pay | Admitting: Internal Medicine

## 2020-09-21 DIAGNOSIS — Z1231 Encounter for screening mammogram for malignant neoplasm of breast: Secondary | ICD-10-CM

## 2020-12-19 ENCOUNTER — Other Ambulatory Visit: Payer: Self-pay | Admitting: Internal Medicine

## 2020-12-19 DIAGNOSIS — Z1231 Encounter for screening mammogram for malignant neoplasm of breast: Secondary | ICD-10-CM

## 2021-01-03 ENCOUNTER — Other Ambulatory Visit: Payer: Self-pay

## 2021-01-03 ENCOUNTER — Ambulatory Visit
Admission: RE | Admit: 2021-01-03 | Discharge: 2021-01-03 | Disposition: A | Discharge status: Home / Self Care | Payer: Managed Care, Other (non HMO) | Source: Ambulatory Visit | Attending: Internal Medicine | Admitting: Internal Medicine

## 2021-01-03 DIAGNOSIS — Z1231 Encounter for screening mammogram for malignant neoplasm of breast: Secondary | ICD-10-CM | POA: Diagnosis not present

## 2022-01-29 ENCOUNTER — Other Ambulatory Visit: Payer: Self-pay | Admitting: Internal Medicine

## 2022-01-29 DIAGNOSIS — Z1231 Encounter for screening mammogram for malignant neoplasm of breast: Secondary | ICD-10-CM

## 2022-03-08 ENCOUNTER — Ambulatory Visit
Admission: RE | Admit: 2022-03-08 | Discharge: 2022-03-08 | Disposition: A | Discharge status: Home / Self Care | Payer: Managed Care, Other (non HMO) | Source: Ambulatory Visit | Attending: Internal Medicine | Admitting: Internal Medicine

## 2022-03-08 DIAGNOSIS — Z1231 Encounter for screening mammogram for malignant neoplasm of breast: Secondary | ICD-10-CM | POA: Insufficient documentation

## 2022-03-12 ENCOUNTER — Other Ambulatory Visit: Payer: Self-pay | Admitting: Internal Medicine

## 2022-03-12 DIAGNOSIS — N6489 Other specified disorders of breast: Secondary | ICD-10-CM

## 2022-03-12 DIAGNOSIS — R928 Other abnormal and inconclusive findings on diagnostic imaging of breast: Secondary | ICD-10-CM

## 2022-03-13 ENCOUNTER — Ambulatory Visit
Admission: RE | Admit: 2022-03-13 | Discharge: 2022-03-13 | Disposition: A | Discharge status: Home / Self Care | Payer: Managed Care, Other (non HMO) | Source: Ambulatory Visit | Attending: Internal Medicine | Admitting: Internal Medicine

## 2022-03-13 DIAGNOSIS — N6489 Other specified disorders of breast: Secondary | ICD-10-CM

## 2022-03-13 DIAGNOSIS — R928 Other abnormal and inconclusive findings on diagnostic imaging of breast: Secondary | ICD-10-CM | POA: Diagnosis present

## 2023-01-08 ENCOUNTER — Other Ambulatory Visit: Payer: Self-pay | Admitting: Internal Medicine

## 2023-01-08 DIAGNOSIS — Z1231 Encounter for screening mammogram for malignant neoplasm of breast: Secondary | ICD-10-CM

## 2023-01-10 NOTE — H&P (Signed)
Pre-Procedure H&P   Patient ID: Kayla Sellers is a 57 y.o. female.  Gastroenterology Provider: Annamaria Helling, DO  Referring Provider: Dr. Doy Sellers PCP: Kayla Crouch, MD  Date: 01/11/2023  HPI [name] is a 57 y.o. female who presents today for Colonoscopy for Surveillance-personal history of colon polyps .  Patient with a personal history of colon polyps.  Patient also with family history of colon polyps-mother.  She reports daily bowel meant without melena hematochezia diarrhea or constipation.  Last underwent colonoscopy in May 2018 with 1 tubular adenomatous polyp and internal hemorrhoids.  Colonoscopy 2013 with tubular adenoma.  Most recent lab work hemoglobin 14.8 MCV 93 platelets 95,000 creatinine 0.81.  Patient is status C-sections, post hysterectomy,  and C-spine fusion.  Positive tobacco use   Past Medical History:  Diagnosis Date   Acid reflux    Chronic pelvic pain in female    Colon polyps    Diverticulosis    Dysmenorrhea    Endometriosis    Fibroid    HA (headache)    HSV-2 (herpes simplex virus 2) infection    Left flank pain    Limb deformities, congenital    Menopausal state    Panic disorder    PONV (postoperative nausea and vomiting)    w/ c section    Thyroid disease    hypothyroidism   Tobacco user    Vaginal dryness    VIN III (vulvar intraepithelial neoplasia III)    s/p wle    Past Surgical History:  Procedure Laterality Date   ANTERIOR CERVICLE SPINE FUSION  2012   c6/7   CESAREAN SECTION     ESOPHAGOGASTRODUODENOSCOPY (EGD) WITH PROPOFOL N/A 12/02/2015   Procedure: ESOPHAGOGASTRODUODENOSCOPY (EGD) WITH PROPOFOL;  Surgeon: Manya Silvas, MD;  Location: [facility] ENDOSCOPY;  Service: Endoscopy;  Laterality: N/A;   HAND SURGERY Right    bone graft   LESION REMOVAL Left 06/21/2020   Procedure: EXCISION OF NECK CYST;  Surgeon: Clyde Canterbury, MD;  Location: [place];  Service: ENT;  Laterality:  Left;   OOPHORECTOMY Right    TUBAL LIGATION     VAGINAL HYSTERECTOMY     wide local excision  2013   pos margin    Family History Mother-colon polyps No h/o GI disease or malignancy  Review of Systems  Constitutional:  Negative for activity change, appetite change, chills, diaphoresis, fatigue, fever and unexpected weight change.  HENT:  Negative for trouble swallowing and voice change.   Respiratory:  Negative for shortness of breath and wheezing.   Cardiovascular:  Negative for chest pain, palpitations and leg swelling.  Gastrointestinal:  Negative for abdominal distention, abdominal pain, anal bleeding, blood in stool, constipation, diarrhea, nausea, rectal pain and vomiting.  Musculoskeletal:  Negative for arthralgias and myalgias.  Skin:  Negative for color change and pallor.  Neurological:  Negative for dizziness, syncope and weakness.  Psychiatric/Behavioral:  Negative for confusion.   All other systems reviewed and are negative.    Medications No current facility-administered medications on file prior to encounter.   Current Outpatient Medications on File Prior to Encounter  Medication Sig Dispense Refill   estradiol (ESTRACE) 1 MG tablet Take 1 tablet (1 mg total) by mouth daily. 90 tablet 1   ibuprofen (ADVIL,MOTRIN) 800 MG tablet Take 800 mg by mouth every 8 (eight) hours as needed.       levothyroxine (SYNTHROID) 150 MCG tablet      PARoxetine (PAXIL) 10 MG  tablet Take 10 mg by mouth daily.     propranolol (INDERAL) 60 MG tablet Take 60 mg by mouth once.     pantoprazole (PROTONIX) 40 MG tablet  (Patient not taking: Reported on 06/14/2020)     sucralfate (CARAFATE) 1 g tablet Take 1 g by mouth 4 (four) times daily -  with meals and at bedtime. (Patient not taking: Reported on 06/14/2020)     valACYclovir (VALTREX) 1000 MG tablet Take 1 tablet (1,000 mg total) by mouth 2 (two) times daily. 60 tablet 3    Pertinent medications related to GI and procedure were reviewed  by me with the patient prior to the procedure   Current Facility-Administered Medications:    0.9 %  sodium chloride infusion, , Intravenous, Continuous, Kayla Helling, DO, Last Rate: 20 mL/hr at 01/11/23 0745, 20 mL/hr at 01/11/23 0745      Allergies  Allergen Reactions   Flagyl [Metronidazole] Anaphylaxis   Iodinated Contrast Media Anaphylaxis   Sulfa Drugs Cross Reactors     hives   Allergies were reviewed by me prior to the procedure  Objective   Body mass index is 30.11 kg/m. Vitals:   01/11/23 0733  BP: 131/81  Pulse: 82  Resp: 18  Temp: (!) 96.6 F (35.9 C)  TempSrc: Temporal  SpO2: 97%  Weight: 74.7 kg  Height: 5\' 2"  (1.575 m)     Physical Exam Vitals and nursing note reviewed.  Constitutional:      General: She is not in acute distress.    Appearance: Normal appearance. She is not ill-appearing, toxic-appearing or diaphoretic.  HENT:     Head: Normocephalic and atraumatic.     Nose: Nose normal.     Mouth/Throat:     Mouth: Mucous membranes are moist.     Pharynx: Oropharynx is clear.  Eyes:     General: No scleral icterus.    Extraocular Movements: Extraocular movements intact.  Cardiovascular:     Rate and Rhythm: Normal rate and regular rhythm.     Heart sounds: Normal heart sounds. No murmur heard.    No friction rub. No gallop.  Pulmonary:     Effort: Pulmonary effort is normal. No respiratory distress.     Breath sounds: Normal breath sounds. No wheezing, rhonchi or rales.  Abdominal:     General: Bowel sounds are normal. There is no distension.     Palpations: Abdomen is soft.     Tenderness: There is no abdominal tenderness. There is no guarding or rebound.  Musculoskeletal:     Cervical back: Neck supple.     Right lower leg: No edema.     Left lower leg: No edema.  Skin:    General: Skin is warm and dry.     Coloration: Skin is not jaundiced or pale.  Neurological:     General: No focal deficit present.     Mental Status:  She is alert and oriented to person, place, and time. Mental status is at baseline.  Psychiatric:        Mood and Affect: Mood normal.        Behavior: Behavior normal.        Thought Content: Thought content normal.        Judgment: Judgment normal.      Assessment:  [name] is a 57 y.o. female  who presents today for Colonoscopy for Surveillance-personal history of colon polyps.  Plan:  Colonoscopy with possible intervention today  Colonoscopy  with possible biopsy, control of bleeding, polypectomy, and interventions as necessary has been discussed with the patient/patient representative. Informed consent was obtained from the patient/patient representative after explaining the indication, nature, and risks of the procedure including but not limited to death, bleeding, perforation, missed neoplasm/lesions, cardiorespiratory compromise, and reaction to medications. Opportunity for questions was given and appropriate answers were provided. Patient/patient representative has verbalized understanding is amenable to undergoing the procedure.   Kayla Helling, DO  [facility]astro[facility]  Portions of the record may have been created with voice recognition software. Occasional wrong-word or 'sound-a-like' substitutions may have occurred due to the inherent limitations of voice recognition software.  Read the chart carefully and recognize, using context, where substitutions may have occurred.

## 2023-01-11 ENCOUNTER — Ambulatory Visit: Payer: Managed Care, Other (non HMO) | Admitting: Anesthesiology

## 2023-01-11 ENCOUNTER — Encounter
Admission: RE | Disposition: A | Discharge status: Home / Self Care | Payer: Self-pay | Source: Ambulatory Visit | Attending: Gastroenterology

## 2023-01-11 ENCOUNTER — Ambulatory Visit
Admission: RE | Admit: 2023-01-11 | Discharge: 2023-01-11 | Disposition: A | Discharge status: Home / Self Care | Payer: Managed Care, Other (non HMO) | Source: Ambulatory Visit | Attending: Gastroenterology | Admitting: Gastroenterology

## 2023-01-11 ENCOUNTER — Encounter: Payer: Self-pay | Admitting: Gastroenterology

## 2023-01-11 DIAGNOSIS — K219 Gastro-esophageal reflux disease without esophagitis: Secondary | ICD-10-CM | POA: Insufficient documentation

## 2023-01-11 DIAGNOSIS — K640 First degree hemorrhoids: Secondary | ICD-10-CM | POA: Diagnosis not present

## 2023-01-11 DIAGNOSIS — Z83719 Family history of colon polyps, unspecified: Secondary | ICD-10-CM | POA: Diagnosis not present

## 2023-01-11 DIAGNOSIS — F17210 Nicotine dependence, cigarettes, uncomplicated: Secondary | ICD-10-CM | POA: Insufficient documentation

## 2023-01-11 DIAGNOSIS — E039 Hypothyroidism, unspecified: Secondary | ICD-10-CM | POA: Insufficient documentation

## 2023-01-11 DIAGNOSIS — D125 Benign neoplasm of sigmoid colon: Secondary | ICD-10-CM | POA: Diagnosis not present

## 2023-01-11 DIAGNOSIS — K5730 Diverticulosis of large intestine without perforation or abscess without bleeding: Secondary | ICD-10-CM | POA: Insufficient documentation

## 2023-01-11 DIAGNOSIS — Z1211 Encounter for screening for malignant neoplasm of colon: Secondary | ICD-10-CM | POA: Diagnosis not present

## 2023-01-11 HISTORY — PX: COLONOSCOPY WITH PROPOFOL: SHX5780

## 2023-01-11 SURGERY — COLONOSCOPY WITH PROPOFOL
Anesthesia: General

## 2023-01-11 MED ORDER — PROPOFOL 1000 MG/100ML IV EMUL
INTRAVENOUS | Status: AC
  Filled 2023-01-11: qty 100

## 2023-01-11 MED ORDER — GLYCOPYRROLATE 0.2 MG/ML IJ SOLN
INTRAMUSCULAR | Status: AC
  Filled 2023-01-11: qty 1

## 2023-01-11 MED ORDER — PROPOFOL 500 MG/50ML IV EMUL
INTRAVENOUS | Status: DC | PRN
  Administered 2023-01-11: 150 ug/kg/min via INTRAVENOUS

## 2023-01-11 MED ORDER — SODIUM CHLORIDE 0.9 % IV SOLN
INTRAVENOUS | Status: DC
  Administered 2023-01-11: 20 mL/h via INTRAVENOUS

## 2023-01-11 MED ORDER — PROPOFOL 10 MG/ML IV BOLUS
INTRAVENOUS | Status: DC | PRN
  Administered 2023-01-11: 60 mg via INTRAVENOUS

## 2023-01-11 MED ORDER — PHENYLEPHRINE HCL (PRESSORS) 10 MG/ML IV SOLN
INTRAVENOUS | Status: DC | PRN
  Administered 2023-01-11: 80 ug via INTRAVENOUS

## 2023-01-11 MED ORDER — GLYCOPYRROLATE 0.2 MG/ML IJ SOLN
INTRAMUSCULAR | Status: DC | PRN
  Administered 2023-01-11: .2 mg via INTRAVENOUS

## 2023-01-11 MED ORDER — PHENYLEPHRINE 80 MCG/ML (10ML) SYRINGE FOR IV PUSH (FOR BLOOD PRESSURE SUPPORT)
PREFILLED_SYRINGE | INTRAVENOUS | Status: AC
  Filled 2023-01-11: qty 10

## 2023-01-11 NOTE — Interval H&P Note (Signed)
History and Physical Interval Note: Preprocedure H&P from 01/11/23  was reviewed and there was no interval change after seeing and examining the patient.  Written consent was obtained from the patient after discussion of risks, benefits, and alternatives. Patient has consented to proceed with Colonoscopy with possible intervention   01/11/2023 8:10 AM  Kayla Sellers  has presented today for surgery, with the diagnosis of personal history adenomatous polyp.  The various methods of treatment have been discussed with the patient and family. After consideration of risks, benefits and other options for treatment, the patient has consented to  Procedure(s): COLONOSCOPY WITH PROPOFOL (N/A) as a surgical intervention.  The patient's history has been reviewed, patient examined, no change in status, stable for surgery.  I have reviewed the patient's chart and labs.  Questions were answered to the patient's satisfaction.     Annamaria Helling

## 2023-01-11 NOTE — Anesthesia Preprocedure Evaluation (Signed)
Anesthesia Evaluation  Patient identified by MRN, date of birth, ID band Patient awake    Reviewed: Allergy & Precautions, NPO status , Patient's Chart, lab work & pertinent test results  Airway Mallampati: II  TM Distance: >3 FB Neck ROM: full    Dental  (+) Teeth Intact   Pulmonary neg pulmonary ROS, Current Smoker and Patient abstained from smoking.   Pulmonary exam normal  + decreased breath sounds      Cardiovascular Exercise Tolerance: Good negative cardio ROS Normal cardiovascular exam Rhythm:Regular Rate:Normal     Neuro/Psych  Headaches negative neurological ROS  negative psych ROS   GI/Hepatic negative GI ROS, Neg liver ROS,GERD  Medicated,,  Endo/Other  negative endocrine ROSHypothyroidism    Renal/GU negative Renal ROS  negative genitourinary   Musculoskeletal negative musculoskeletal ROS (+)    Abdominal  (+) + obese  Peds negative pediatric ROS (+)  Hematology negative hematology ROS (+)   Anesthesia Other Findings Past Medical History: No date: Acid reflux No date: Chronic pelvic pain in female No date: Colon polyps No date: Diverticulosis No date: Dysmenorrhea No date: Endometriosis No date: Fibroid No date: HA (headache) No date: HSV-2 (herpes simplex virus 2) infection No date: Left flank pain No date: Limb deformities, congenital No date: Menopausal state No date: Panic disorder No date: PONV (postoperative nausea and vomiting)     Comment:  w/ c section  No date: Thyroid disease     Comment:  hypothyroidism No date: Tobacco user No date: Vaginal dryness No date: VIN III (vulvar intraepithelial neoplasia III)     Comment:  s/p wle  Past Surgical History: 2012: ANTERIOR CERVICLE SPINE FUSION     Comment:  c6/7 No date: CESAREAN SECTION 12/02/2015: ESOPHAGOGASTRODUODENOSCOPY (EGD) WITH PROPOFOL; N/A     Comment:  Procedure: ESOPHAGOGASTRODUODENOSCOPY (EGD) WITH                PROPOFOL;  Surgeon: Manya Silvas, MD;  Location: [facility]              ENDOSCOPY;  Service: Endoscopy;  Laterality: N/A; No date: HAND SURGERY; Right     Comment:  bone graft 06/21/2020: LESION REMOVAL; Left     Comment:  Procedure: EXCISION OF NECK CYST;  Surgeon: Clyde Canterbury, MD;  Location: [place];  Service: ENT;               Laterality: Left; No date: OOPHORECTOMY; Right No date: TUBAL LIGATION No date: VAGINAL HYSTERECTOMY 2013: wide local excision     Comment:  pos margin  BMI    Body Mass Index: 30.11 kg/m      Reproductive/Obstetrics negative OB ROS                             Anesthesia Physical Anesthesia Plan  ASA: 2  Anesthesia Plan: General   Post-op Pain Management:    Induction: Intravenous  PONV Risk Score and Plan: Propofol infusion and TIVA  Airway Management Planned: Natural Airway  Additional Equipment:   Intra-op Plan:   Post-operative Plan:   Informed Consent: I have reviewed the patients History and Physical, chart, labs and discussed the procedure including the risks, benefits and alternatives for the proposed anesthesia with the patient or authorized representative who has indicated his/her understanding and acceptance.     Dental Advisory Given  Plan Discussed with:  Anesthesiologist, CRNA and Surgeon  Anesthesia Plan Comments:        Anesthesia Quick Evaluation

## 2023-01-11 NOTE — Interval H&P Note (Signed)
History and Physical Interval Note: Preprocedure H&P from 01/11/23  was reviewed and there was no interval change after seeing and examining the patient.  Written consent was obtained from the patient after discussion of risks, benefits, and alternatives. Patient has consented to proceed with Colonoscopy with possible intervention   01/11/2023 8:09 AM  Kayla Sellers  has presented today for surgery, with the diagnosis of personal history adenomatous polyp.  The various methods of treatment have been discussed with the patient and family. After consideration of risks, benefits and other options for treatment, the patient has consented to  Procedure(s): COLONOSCOPY WITH PROPOFOL (N/A) as a surgical intervention.  The patient's history has been reviewed, patient examined, no change in status, stable for surgery.  I have reviewed the patient's chart and labs.  Questions were answered to the patient's satisfaction.     Annamaria Helling

## 2023-01-11 NOTE — Anesthesia Postprocedure Evaluation (Signed)
Anesthesia Post Note  Patient: Kayla Sellers  Procedure(s) Performed: COLONOSCOPY WITH PROPOFOL  Patient location during evaluation: PACU Anesthesia Type: General Level of consciousness: awake and oriented Pain management: pain level controlled Vital Signs Assessment: post-procedure vital signs reviewed and stable Respiratory status: spontaneous breathing and nonlabored ventilation Cardiovascular status: blood pressure returned to baseline Anesthetic complications: no   No notable events documented.   Last Vitals:  Vitals:   01/11/23 0856 01/11/23 0902  BP: 127/73 120/65  Pulse: 66 63  Resp: 15 14  Temp:    SpO2: 100% 100%    Last Pain:  Vitals:   01/11/23 0902  TempSrc:   PainSc: 0-No pain                 VAN STAVEREN,Janeal Abadi

## 2023-01-11 NOTE — Transfer of Care (Signed)
Immediate Anesthesia Transfer of Care Note  Patient: Kayla Sellers  Procedure(s) Performed: COLONOSCOPY WITH PROPOFOL  Patient Location: PACU  Anesthesia Type:General  Level of Consciousness: drowsy  Airway & Oxygen Therapy: Patient Spontanous Breathing  Post-op Assessment: Report given to RN and Post -op Vital signs reviewed and stable  Post vital signs: Reviewed and stable  Last Vitals:  Vitals Value Taken Time  BP 101/66 01/11/23 0842  Temp 36.1 C 01/11/23 0842  Pulse 68 01/11/23 0842  Resp 16 01/11/23 0842  SpO2 100 % 01/11/23 0842    Last Pain:  Vitals:   01/11/23 0842  TempSrc: Temporal  PainSc: Asleep         Complications: No notable events documented.

## 2023-01-11 NOTE — Op Note (Addendum)
[facility] Gastroenterology Patient Name: Kayla Sellers Procedure Date: 01/11/2023 8:00 AM MRN: [id number] Account #: [id number] Date of Birth: 12-08-1965 Admit Type: Outpatient Age: 57 Room: [facility] ENDO ROOM 1 Gender: Female Note Status: Finalized Instrument Name: Colonscope [id number] Procedure:             Colonoscopy Indications:           High risk colon cancer surveillance: Personal history                         of colonic polyps Providers:             Rueben Bash, DO Referring MD:          Leonie Douglas. Doy Hutching, MD (Referring MD) Medicines:             Monitored Anesthesia Care Complications:         No immediate complications. Estimated blood loss:                         Minimal. Procedure:             Pre-Anesthesia Assessment:                        - Prior to the procedure, a History and Physical was                         performed, and patient medications and allergies were                         reviewed. The patient is competent. The risks and                         benefits of the procedure and the sedation options and                         risks were discussed with the patient. All questions                         were answered and informed consent was obtained.                         Patient identification and proposed procedure were                         verified by the physician, the nurse, the anesthetist                         and the technician in the endoscopy suite. Mental                         Status Examination: alert and oriented. Airway                         Examination: normal oropharyngeal airway and neck                         mobility. Respiratory Examination: clear to  auscultation. CV Examination: RRR, no murmurs, no S3                         or S4. Prophylactic Antibiotics: The patient does not                         require prophylactic antibiotics. Prior                          Anticoagulants: The patient has taken no anticoagulant                         or antiplatelet agents. ASA Grade Assessment: II - A                         patient with mild systemic disease. After reviewing                         the risks and benefits, the patient was deemed in                         satisfactory condition to undergo the procedure. The                         anesthesia plan was to use monitored anesthesia care                         (MAC). Immediately prior to administration of                         medications, the patient was re-assessed for adequacy                         to receive sedatives. The heart rate, respiratory                         rate, oxygen saturations, blood pressure, adequacy of                         pulmonary ventilation, and response to care were                         monitored throughout the procedure. The physical                         status of the patient was re-assessed after the                         procedure.                        After obtaining informed consent, the colonoscope was                         passed under direct vision. Throughout the procedure,                         the patient's blood pressure, pulse, and oxygen  saturations were monitored continuously. The                         Colonoscope was introduced through the anus and                         advanced to the the terminal ileum, with                         identification of the appendiceal orifice and IC                         valve. The colonoscopy was performed without                         difficulty. The patient tolerated the procedure well.                         The quality of the bowel preparation was evaluated                         using the BBPS [facility] Bowel Preparation Scale) with                         scores of: Right Colon = 3 (entire mucosa seen well                         with no residual staining,  small fragments of stool or                         opaque liquid), Transverse Colon = 3 (entire mucosa                         seen well with no residual staining, small fragments                         of stool or opaque liquid) and Left Colon = 2 (minor                         amount of residual staining, small fragments of stool                         and/or opaque liquid, but mucosa seen well). The total                         BBPS score equals 8. The quality of the bowel                         preparation was excellent. The terminal ileum,                         ileocecal valve, appendiceal orifice, and rectum were                         photographed. Findings:      The perianal and digital rectal examinations were normal. Pertinent       negatives include normal sphincter tone.  The terminal ileum appeared normal. Estimated blood loss: none.      A few small-mouthed diverticula were found in the recto-sigmoid colon       and sigmoid colon. Estimated blood loss: none.      A 1 to 2 mm polyp was found in the sigmoid colon. The polyp was sessile.       The polyp was removed with a jumbo cold forceps. Resection and retrieval       were complete. Estimated blood loss was minimal.      Non-bleeding internal hemorrhoids were found during retroflexion. The       hemorrhoids were Grade I (internal hemorrhoids that do not prolapse).       Estimated blood loss: none.      The exam was otherwise without abnormality on direct and retroflexion       views. Impression:            - The examined portion of the ileum was normal.                        - Diverticulosis in the recto-sigmoid colon and in the                         sigmoid colon.                        - One 1 to 2 mm polyp in the sigmoid colon, removed                         with a jumbo cold forceps. Resected and retrieved.                        - Non-bleeding internal hemorrhoids.                        - The  examination was otherwise normal on direct and                         retroflexion views. Recommendation:        - Patient has a contact number available for                         emergencies. The signs and symptoms of potential                         delayed complications were discussed with the patient.                         Return to normal activities tomorrow. Written                         discharge instructions were provided to the patient.                        - Discharge patient to home.                        - Resume previous diet.                        -  Continue present medications.                        - Await pathology results.                        - Repeat colonoscopy for surveillance based on                         pathology results.                        - Return to referring physician as previously                         scheduled.                        - The findings and recommendations were discussed with                         the patient. Procedure Code(s):     --- Professional ---                        [phone], Colonoscopy, flexible; with biopsy, single or                         multiple Diagnosis Code(s):     --- Professional ---                        Z86.010, Personal history of colonic polyps                        K64.0, First degree hemorrhoids                        D12.5, Benign neoplasm of sigmoid colon                        K57.30, Diverticulosis of large intestine without                         perforation or abscess without bleeding CPT copyright 2022 American Medical Association. All rights reserved. The codes documented in this report are preliminary and upon coder review may  be revised to meet current compliance requirements. Attending Participation:      I personally performed the entire procedure. Volney American, DO Annamaria Helling DO, DO 01/11/2023 8:45:25 AM This report has been signed electronically. Number of  Addenda: 0 Note Initiated On: 01/11/2023 8:00 AM Scope Withdrawal Time: 0 hours 16 minutes 16 seconds  Total Procedure Duration: 0 hours 19 minutes 41 seconds  Estimated Blood Loss:  Estimated blood loss was minimal.      [facility]

## 2023-01-12 ENCOUNTER — Encounter: Payer: Self-pay | Admitting: Emergency Medicine

## 2023-01-12 ENCOUNTER — Emergency Department
Admission: EM | Admit: 2023-01-12 | Discharge: 2023-01-12 | Disposition: A | Discharge status: Home / Self Care | Payer: Managed Care, Other (non HMO) | Attending: Emergency Medicine | Admitting: Emergency Medicine

## 2023-01-12 ENCOUNTER — Other Ambulatory Visit: Payer: Self-pay

## 2023-01-12 DIAGNOSIS — E039 Hypothyroidism, unspecified: Secondary | ICD-10-CM | POA: Diagnosis not present

## 2023-01-12 DIAGNOSIS — E860 Dehydration: Secondary | ICD-10-CM | POA: Diagnosis not present

## 2023-01-12 DIAGNOSIS — R110 Nausea: Secondary | ICD-10-CM

## 2023-01-12 DIAGNOSIS — F17200 Nicotine dependence, unspecified, uncomplicated: Secondary | ICD-10-CM | POA: Diagnosis not present

## 2023-01-12 LAB — CBC
HCT: 45.1 % (ref 36.0–46.0)
Hemoglobin: 14.9 g/dL (ref 12.0–15.0)
MCH: 31.6 pg (ref 26.0–34.0)
MCHC: 33 g/dL (ref 30.0–36.0)
MCV: 95.6 fL (ref 80.0–100.0)
Platelets: 282 10*3/uL (ref 150–400)
RBC: 4.72 MIL/uL (ref 3.87–5.11)
RDW: 12.1 % (ref 11.5–15.5)
WBC: 5.5 10*3/uL (ref 4.0–10.5)
nRBC: 0 % (ref 0.0–0.2)

## 2023-01-12 LAB — COMPREHENSIVE METABOLIC PANEL
ALT: 16 U/L (ref 0–44)
AST: 25 U/L (ref 15–41)
Albumin: 4.4 g/dL (ref 3.5–5.0)
Alkaline Phosphatase: 46 U/L (ref 38–126)
Anion gap: 9 (ref 5–15)
BUN: 13 mg/dL (ref 6–20)
CO2: 24 mmol/L (ref 22–32)
Calcium: 9.1 mg/dL (ref 8.9–10.3)
Chloride: 105 mmol/L (ref 98–111)
Creatinine, Ser: 0.77 mg/dL (ref 0.44–1.00)
GFR, Estimated: >60 mL/min (ref >60)
Glucose, Bld: 106 mg/dL — ABNORMAL HIGH (ref 70–99)
Potassium: 3.9 mmol/L (ref 3.5–5.1)
Sodium: 138 mmol/L (ref 135–145)
Total Bilirubin: 0.7 mg/dL (ref 0.3–1.2)
Total Protein: 7.6 g/dL (ref 6.5–8.1)

## 2023-01-12 LAB — URINALYSIS, ROUTINE W REFLEX MICROSCOPIC
Bacteria, UA: NONE SEEN
Bilirubin Urine: NEGATIVE
Glucose, UA: NEGATIVE mg/dL
Ketones, ur: NEGATIVE mg/dL
Leukocytes,Ua: NEGATIVE
Nitrite: NEGATIVE
Protein, ur: NEGATIVE mg/dL
Specific Gravity, Urine: 1.005 (ref 1.005–1.030)
WBC, UA: NONE SEEN WBC/hpf (ref 0–5)
pH: 5 (ref 5.0–8.0)

## 2023-01-12 LAB — LIPASE, BLOOD: Lipase: 38 U/L (ref 11–51)

## 2023-01-12 MED ORDER — LACTATED RINGERS IV BOLUS
1000.0000 mL | Freq: Once | INTRAVENOUS | Status: AC
  Administered 2023-01-12: 1000 mL via INTRAVENOUS

## 2023-01-12 MED ORDER — ONDANSETRON 4 MG PO TBDP: 4.0000 mg | ORAL_TABLET | Freq: Three times a day (TID) | ORAL | 0 refills | Status: AC | PRN

## 2023-01-12 MED ORDER — ONDANSETRON HCL 4 MG/2ML IJ SOLN
4.0000 mg | Freq: Once | INTRAMUSCULAR | Status: AC
  Administered 2023-01-12: 4 mg via INTRAVENOUS
  Filled 2023-01-12: qty 2

## 2023-01-12 NOTE — ED Notes (Signed)
Nausea improved, but continues. Given ginger ale PO challenge. Husband at [facility].

## 2023-01-12 NOTE — ED Triage Notes (Signed)
Pt via POV from home. Pt c/o lightheadedness, dry mouth, nausea, and brain fog every since her colonscopy yesterday. Denies actually vomiting. States that she has been having diarrhea prior to the bowel prep for her colonscopy. Pt is A&Ox4 and NAD

## 2023-01-12 NOTE — ED Notes (Signed)
Attempting 12 lead EKG. Phillips monitor not reading.

## 2023-01-12 NOTE — ED Provider Notes (Signed)
[facility] Provider Note    Event Date/Time   First MD Initiated Contact with Patient 01/12/23 [phone]     (approximate)   History   Nausea   HPI  Kayla Sellers is a 57 y.o. female presents with nausea and feeling generally unwell.  Patient had colonoscopy yesterday.  She is not sure whether she was intubated.  After colonoscopy had significant dry mouth to the point she is not able to eat.  She slept most of the day was able to eat normal dinner.  When she woke up today continue to have significant dry mouth and has had nausea.  Has had a little bit of water nothing to eat.  She has some mild abdominal discomfort but not severe.  Did have 1 loose bowel movement today no blood.  She also endorses feeling somewhat woozy and has abnormal sensation of her head but denies head pain denies fevers chills runny nose cough.  Earlier in the week she had diarrhea but no vomiting.  She then did the bowel prep prior to colonoscopy.  Reviewed medical record.  Patient had colonoscopy with Dr. Virgina Jock done yesterday.  Did have 1 polyp removed.     Past Medical History:  Diagnosis Date   Acid reflux    Chronic pelvic pain in female    Colon polyps    Diverticulosis    Dysmenorrhea    Endometriosis    Fibroid    HA (headache)    HSV-2 (herpes simplex virus 2) infection    Left flank pain    Limb deformities, congenital    Menopausal state    Panic disorder    PONV (postoperative nausea and vomiting)    w/ c section    Thyroid disease    hypothyroidism   Tobacco user    Vaginal dryness    VIN III (vulvar intraepithelial neoplasia III)    s/p wle    Patient Active Problem List   Diagnosis Date Noted   Menopausal symptom 03/25/2018   Spongiotic dermatitis 03/14/2016   Status post hysterectomy 11/10/2015   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/10/2015   Chronic abdominal pain 05/03/2015   Chronic headache 05/03/2015   Colon polyp 05/03/2015    DD (diverticular disease) 05/03/2015   Deformity of hand 05/03/2015   Genital herpes 05/03/2015   Episodic paroxysmal anxiety disorder 05/03/2015   Disease of thyroid gland 05/03/2015   Compulsive tobacco user syndrome 05/03/2015   Congenital limb deformity 07/16/2011   Hypothyroidism 07/16/2011     Physical Exam  Triage Vital Signs: ED Triage Vitals  Enc Vitals Group     BP      Pulse      Resp      Temp      Temp src      SpO2      Weight      Height      Head Circumference      Peak Flow      Pain Score      Pain Loc      Pain Edu?      Excl. in [place]?     Most recent vital signs: Vitals:   01/12/23 0945 01/12/23 1005  BP:  (!) 113/59  Pulse: (!) 55 (!) 58  Resp: 12 13  Temp:    SpO2: 100% 100%     General: Awake, no distress.  Mucous membranes are dry CV:  Good peripheral perfusion.  Resp:  Normal effort.  Abd:  No distention.  Mild tenderness in right lower abdomen but no guarding abdomen soft Neuro:             Awake, Alert, Oriented x 3  Other:     ED Results / Procedures / Treatments  Labs (all labs ordered are listed, but only abnormal results are displayed) Labs Reviewed  COMPREHENSIVE METABOLIC PANEL - Abnormal; Notable for the following components:      Result Value   Glucose, Bld 106 (*)    All other components within normal limits  URINALYSIS, ROUTINE W REFLEX MICROSCOPIC - Abnormal; Notable for the following components:   Color, Urine STRAW (*)    APPearance CLEAR (*)    Hgb urine dipstick SMALL (*)    All other components within normal limits  LIPASE, BLOOD  CBC     EKG  EKG interpretation performed by myself: NSR, nml axis, nml intervals, no acute ischemic changes   RADIOLOGY    PROCEDURES:  Critical Care performed: No  Procedures  The patient is on the cardiac monitor to evaluate for evidence of arrhythmia and/or significant heart rate changes.   MEDICATIONS ORDERED IN ED: Medications  lactated ringers bolus 1,000  mL (0 mLs Intravenous Stopped 01/12/23 1007)  ondansetron (ZOFRAN) injection 4 mg (4 mg Intravenous Given 01/12/23 0947)     IMPRESSION / MDM / ASSESSMENT AND PLAN / ED COURSE  I reviewed the triage vital signs and the nursing notes.                              Patient's presentation is most consistent with acute complicated illness / injury requiring diagnostic workup.  Differential diagnosis includes, but is not limited to, side effect of anesthesia, dehydration, AKI, electrolyte abnormality, gastroenteritis  Patient is a 57 year old female who presents because of dry mouth nausea feeling generally unwell.  Did have colonoscopy and underwent anesthesia yesterday.  Afterward she had dry mouth but was able to eat and drink.  This morning continues to have dry mouth and nausea had an episode of loose stool and feels generally unwell and woozy.  She is hypotensive on arrival initial map in the 33s.  She looks overall well nontoxic does have some dry mucous membranes abdominal exam is overall benign with just some mild tenderness in the right lower quadrant area.  She also had diarrhea earlier in the week prior to the colonoscopy.  I suspect that she may be dry due to GI losses early in the week followed by bowel prep and then not eating and drinking much yesterday after the colonoscopy.  Could also be developing gastroenteritis.  I am reassured by her abdominal exam.  Will give bolus of fluid antiemetics check some basic labs.   Patient's blood pressure improved after bolus.  CBC CMP and lipase are all reassuring.  Nausea has improved and she overall feels improved on reassessment.  On repeat abdominal exam she does have mild tenderness in the right lower abdomen no guarding will not pursue imaging at this time.  Did discuss with her that if abdominal pain is worsening that she return to ED, pressure given recent colonoscopy.  Patient was able to pass p.o. challenge.  Will discharge.          FINAL CLINICAL IMPRESSION(S) / ED DIAGNOSES   Final diagnoses:  Nausea  Dehydration     Rx / DC Orders   ED Discharge Orders  None        Note:  This document was prepared using Dragon voice recognition software and may include unintentional dictation errors.   Rada Hay, MD 01/12/23 1101

## 2023-01-12 NOTE — Progress Notes (Signed)
Non-identified Voicemail.  No Message Left. 

## 2023-01-12 NOTE — ED Notes (Addendum)
Delay in d/c d/t other critical pts 

## 2023-01-12 NOTE — Discharge Instructions (Signed)
You can take the Zofran as needed for nausea and vomiting.  If you develop worsening abdominal pain or not able to keep fluids down please return to the emergency department.

## 2023-01-14 ENCOUNTER — Encounter: Payer: Self-pay | Admitting: Gastroenterology

## 2023-01-14 LAB — SURGICAL PATHOLOGY

## 2023-07-01 ENCOUNTER — Emergency Department: Payer: Managed Care, Other (non HMO)

## 2023-07-01 ENCOUNTER — Other Ambulatory Visit: Payer: Self-pay

## 2023-07-01 ENCOUNTER — Emergency Department
Admission: EM | Admit: 2023-07-01 | Discharge: 2023-07-01 | Disposition: A | Discharge status: Home / Self Care | Payer: Managed Care, Other (non HMO) | Attending: Emergency Medicine | Admitting: Emergency Medicine

## 2023-07-01 DIAGNOSIS — R0602 Shortness of breath: Secondary | ICD-10-CM | POA: Diagnosis not present

## 2023-07-01 DIAGNOSIS — R0789 Other chest pain: Secondary | ICD-10-CM | POA: Diagnosis present

## 2023-07-01 DIAGNOSIS — R079 Chest pain, unspecified: Secondary | ICD-10-CM

## 2023-07-01 LAB — CBC
HCT: 42.2 % (ref 36.0–46.0)
Hemoglobin: 14 g/dL (ref 12.0–15.0)
MCH: 31.5 pg (ref 26.0–34.0)
MCHC: 33.2 g/dL (ref 30.0–36.0)
MCV: 95 fL (ref 80.0–100.0)
Platelets: 281 10*3/uL (ref 150–400)
RBC: 4.44 MIL/uL (ref 3.87–5.11)
RDW: 12 % (ref 11.5–15.5)
WBC: 5.3 10*3/uL (ref 4.0–10.5)
nRBC: 0 % (ref 0.0–0.2)

## 2023-07-01 LAB — BASIC METABOLIC PANEL
Anion gap: 8 (ref 5–15)
BUN: 14 mg/dL (ref 6–20)
CO2: 26 mmol/L (ref 22–32)
Calcium: 9 mg/dL (ref 8.9–10.3)
Chloride: 103 mmol/L (ref 98–111)
Creatinine, Ser: 0.61 mg/dL (ref 0.44–1.00)
GFR, Estimated: >60 mL/min (ref >60)
Glucose, Bld: 127 mg/dL — ABNORMAL HIGH (ref 70–99)
Potassium: 3.7 mmol/L (ref 3.5–5.1)
Sodium: 137 mmol/L (ref 135–145)

## 2023-07-01 LAB — TROPONIN I (HIGH SENSITIVITY): Troponin I (High Sensitivity): 3 ng/L (ref <18)

## 2023-07-01 NOTE — Discharge Instructions (Signed)
You were seen in the Emergency Department today for evaluation of your chest pain. Fortunately, your labs, EKG, and chest x- were overall reassuring against a emergency cause for your pain. Please follow-up with your primary doctor within the next few days for reevaluation. You can take Tylenol and ibuprofen as needed for your pain, unless there is another reason that you should not take these. Return to the ER for any new or worsening symptoms including worsening chest pain, difficulty breathing, or any other new or concerning symptoms that you believe warrants immediate attention.   

## 2023-07-01 NOTE — ED Notes (Signed)
Pt d/c home per MD order. Discharge summary reviewed , pt verbalizes understanding. Ambulatory off unit with visitor. No s/s of acute distress noted.  

## 2023-07-01 NOTE — ED Triage Notes (Signed)
Pt sts that she has been having chest pain for the last couple of days. Pt sts that the pain sometimes starts on the right side of her chest than migrates to the center.

## 2023-07-01 NOTE — ED Provider Notes (Signed)
[facility] Provider Note    Event Date/Time   First MD Initiated Contact with Patient 07/01/23 1735     (approximate)   History   Chest Pain   HPI  Kayla Sellers is a 57 year old female presenting to the emergency department for evaluation of chest pain.  Patient reports that over the last few weeks she has had some intermittent chest pain described as a tightness.  She reports it was primarily along the right side of her chest, but more recently has migrated closer to the center.  Reports occasional shortness of breath, but pain is not specifically pleuritic or exertional.  No fevers or chills.  No recent cough, congestion.  Denies similar prior episodes.  No abdominal pain.       Physical Exam   Triage Vital Signs: ED Triage Vitals  Encounter Vitals Group     BP 07/01/23 1740 139/82     Systolic BP Percentile --      Diastolic BP Percentile --      Pulse Rate 07/01/23 1740 63     Resp 07/01/23 1740 16     Temp 07/01/23 1740 98.4 F (36.9 C)     Temp Source 07/01/23 1740 Oral     SpO2 07/01/23 1740 99 %     Weight 07/01/23 1729 165 lb (74.8 kg)     Height 07/01/23 1729 5\' 2"  (1.575 m)     Head Circumference --      Peak Flow --      Pain Score 07/01/23 1729 5     Pain Loc --      Pain Education --      Exclude from Growth Chart --     Most recent vital signs: Vitals:   07/01/23 2000 07/01/23 2030  BP: 112/61 (!) 118/48  Pulse: 70 66  Resp: 16 15  Temp:    SpO2: 96% 98%     General: Awake, interactive  CV:  Regular rate, good peripheral perfusion.  Chest wall: Nontender to palpation Resp:  Lungs clear, unlabored respirations.  Abd:  Soft, nondistended.  Neuro:  Symmetric facial movement, fluid speech   ED Results / Procedures / Treatments   Labs (all labs ordered are listed, but only abnormal results are displayed) Labs Reviewed  BASIC METABOLIC PANEL - Abnormal; Notable for the following components:      Result  Value   Glucose, Bld 127 (*)    All other components within normal limits  CBC  TROPONIN I (HIGH SENSITIVITY)     EKG EKG independently reviewed interpreted by myself (ER attending) demonstrates:  EKG demonstrates normal sinus rhythm rate of 71, PR 152, QRS 86, QT 466, no acute ST changes  RADIOLOGY Imaging independently reviewed and interpreted by myself demonstrates:  CXR without focal consolidation on my review  PROCEDURES:  Critical Care performed: No  Procedures   MEDICATIONS ORDERED IN ED: Medications - No data to display   IMPRESSION / MDM / ASSESSMENT AND PLAN / ED COURSE  I reviewed the triage vital signs and the nursing notes.  Differential diagnosis includes, but is not limited to, pneumonia, pneumothorax, anemia, electrolyte abnormality, consideration but overall lower suspicion for ACS.  Clinical history not suggestive of PE or aortic dissection.  Patient's presentation is most consistent with acute presentation with potential threat to life or bodily function.  57 year old female presenting to the emergency department for several weeks of chest pain.  Vital signs stable on presentation.  Labs  reassuring including normal troponin.  With several weeks of symptoms, do not think repeat indicated.  EKG nonischemic.  CXR without focal consolidation.  Patient reevaluated after completion of workup and reports feeling improved.  Do think she is will for discharge home.  She does have a primary care doctor who she will follow-up with.  Strict return precautions provided.  Patient discharged in stable condition.    FINAL CLINICAL IMPRESSION(S) / ED DIAGNOSES   Final diagnoses:  Nonspecific chest pain     Rx / DC Orders   ED Discharge Orders     None        Note:  This document was prepared using Dragon voice recognition software and may include unintentional dictation errors.   Trinna Post, MD 07/01/23 [phone]

## 2023-09-11 ENCOUNTER — Other Ambulatory Visit: Payer: Self-pay | Admitting: Internal Medicine

## 2023-09-11 DIAGNOSIS — Z1231 Encounter for screening mammogram for malignant neoplasm of breast: Secondary | ICD-10-CM

## 2023-10-01 ENCOUNTER — Ambulatory Visit
Admission: RE | Admit: 2023-10-01 | Discharge: 2023-10-01 | Disposition: A | Discharge status: Home / Self Care | Payer: Managed Care, Other (non HMO) | Source: Ambulatory Visit | Attending: Internal Medicine | Admitting: Internal Medicine

## 2023-10-01 DIAGNOSIS — Z1231 Encounter for screening mammogram for malignant neoplasm of breast: Secondary | ICD-10-CM | POA: Diagnosis present

## 2024-02-01 ENCOUNTER — Other Ambulatory Visit: Payer: Self-pay

## 2024-02-01 ENCOUNTER — Encounter: Payer: Self-pay | Admitting: Intensive Care

## 2024-02-01 ENCOUNTER — Emergency Department: Admission: EM | Admit: 2024-02-01 | Discharge: 2024-02-01 | Disposition: A | Discharge status: Home / Self Care

## 2024-02-01 ENCOUNTER — Emergency Department

## 2024-02-01 DIAGNOSIS — S0990XA Unspecified injury of head, initial encounter: Secondary | ICD-10-CM | POA: Diagnosis present

## 2024-02-01 DIAGNOSIS — W01198A Fall on same level from slipping, tripping and stumbling with subsequent striking against other object, initial encounter: Secondary | ICD-10-CM | POA: Diagnosis not present

## 2024-02-01 DIAGNOSIS — S0083XA Contusion of other part of head, initial encounter: Secondary | ICD-10-CM | POA: Insufficient documentation

## 2024-02-01 DIAGNOSIS — S0093XA Contusion of unspecified part of head, initial encounter: Secondary | ICD-10-CM

## 2024-02-01 NOTE — ED Triage Notes (Addendum)
 Patient presents after falling last night and hitting head on corner of wall. Laceration, bruising and swelling noted to left forehead.   C/o pressure behind left eye and on side of injury and on top of head. Reports hearing a swooshing sound in area of forehead this AM

## 2024-02-01 NOTE — ED Notes (Signed)
EDP into room, at BS.  ?

## 2024-02-01 NOTE — ED Notes (Signed)
 Pt alert, NAD, calm, interactive, resps e/u, speaking in clear complete sentences. VSS. C/o L lateral temporal HA. Denies pain, sob, nausea, dizziness, visual or hearing changes, or neck pain. Skin and TMs unremarkable. No TTP.

## 2024-02-01 NOTE — Discharge Instructions (Signed)
 Your evaluation in the emergency department was reassuring, and a CT scan of your head showed no fracture or brain bleeding.  You can use Tylenol and Motrin as needed for any ongoing discomfort.  Please do follow-up with your primary care doctor for reevaluation, and return to the emergency department with any new or worsening symptoms.

## 2024-02-01 NOTE — ED Notes (Signed)
 Up to b/r prior to d/c

## 2024-02-01 NOTE — ED Triage Notes (Signed)
 Fall last night.  C/O left sided face swelling. No LOC. Sent from [facility] for evaluation

## 2024-02-01 NOTE — ED Provider Notes (Signed)
 [facility] Provider Note    Event Date/Time   First MD Initiated Contact with Patient 02/01/24 [phone]     (approximate)   History   Fall and Head Injury  Fall last night.  C/O left sided face swelling. No LOC. Sent from [facility] for evaluation  Patient presents after falling last night and hitting head on corner of wall. Laceration, bruising and swelling noted to left forehead.   C/o pressure behind left eye and on side of injury and on top of head. Reports hearing a swooshing sound in area of forehead this AM   HPI Kayla Sellers is a 58 y.o. female PMH panic disorder, diverticulosis, acid reflux presents for evaluation of head trauma in setting of fall -Had mechanical fall in her home yesterday evening.  They are in the midst of renovations and she tripped on something on the ground.  Hit her head as she fell.  No LOC, no subsequent vomiting or vision changes.  No focal weakness. -Not on blood thinners -Has mild pain in bilateral knees but ambulates without difficulty.  No other complaints.  Was seen in clinic this morning and sent to emergency department for evaluation of head trauma.      Physical Exam   Triage Vital Signs: ED Triage Vitals [02/01/24 0930]  Encounter Vitals Group     BP 116/64     Systolic BP Percentile      Diastolic BP Percentile      Pulse Rate (!) 102     Resp 18     Temp 98.4 F (36.9 C)     Temp Source Oral     SpO2 100 %     Weight 170 lb (77.1 kg)     Height 5\' 2"  (1.575 m)     Head Circumference      Peak Flow      Pain Score 4     Pain Loc      Pain Education      Exclude from Growth Chart     Most recent vital signs: Vitals:   02/01/24 0940 02/01/24 0945  BP:    Pulse: 91 91  Resp:    Temp:    SpO2: 100% 99%     General: Awake, no distress.  CV:  Good peripheral perfusion. RRR, RP 2+ Resp:  Normal effort. HEENT: Mild hematoma to left forehead with trace periorbital swelling.  Tender to palpation  over left eyebrow but no gross deformity.  No open wounds.  TMs clear bilaterally.  Maxilla stable.  pupils equal and reactive to light. Neck:  Full range of motion, no midline tenderness Abd:  No distention.  EXT:  Full range of motion of all joints, no deformities, mild tenderness palpation bilateral anterior knees.    ED Results / Procedures / Treatments   Labs (all labs ordered are listed, but only abnormal results are displayed) Labs Reviewed - No data to display   EKG  N/a   RADIOLOGY CT head with no acute pathology on my interpretation.  Radiology report reviewed. PROCEDURES:  Critical Care performed: No  Procedures   MEDICATIONS ORDERED IN ED: Medications - No data to display   IMPRESSION / MDM / ASSESSMENT AND PLAN / ED COURSE  I reviewed the triage vital signs and the nursing notes.  DDX/MDM/AP: Differential diagnosis includes, but is not limited to, facial contusion, consider underlying skull fracture or intracranial hemorrhage.  No clinical concern for C-spine injury at this time.  No evidence of any significant extremity injuries, highly doubt any fractures --patient prefers to defer x-ray imaging of knees which I believe is very reasonable.  Patient gives a very mechanical story of fall and is otherwise reassuring eval and history, no concern for other underlying precipitant.  Plan: -N.p.o. -CT head -Patient declines pain medications   Patient's presentation is most consistent with acute presentation with potential threat to life or bodily function.   ED course below.  CT unremarkable, patient reassured by negative findings.  Recommend Tylenol, Motrin as needed.  Plan for PMD follow-up.  No evidence of concussion at this time.  ED return precautions in place.  Patient agrees with plan.  Clinical Course as of 02/01/24 1028  Sat Feb 01, 2024  1026 CTH: IMPRESSION: Scalp swelling without calvarial fracture or evidence  of intracranial injury.   [MM]    Clinical Course User Index [MM] Collis Deaner, MD     FINAL CLINICAL IMPRESSION(S) / ED DIAGNOSES   Final diagnoses:  Contusion of head, unspecified part of head, initial encounter     Rx / DC Orders   ED Discharge Orders     None        Note:  This document was prepared using Dragon voice recognition software and may include unintentional dictation errors.   Collis Deaner, MD 02/01/24 1028

## 2024-06-16 ENCOUNTER — Other Ambulatory Visit: Payer: Self-pay | Admitting: Internal Medicine

## 2024-06-16 DIAGNOSIS — Z1231 Encounter for screening mammogram for malignant neoplasm of breast: Secondary | ICD-10-CM

## 2024-11-02 ENCOUNTER — Other Ambulatory Visit: Payer: Self-pay | Admitting: Student

## 2024-11-02 ENCOUNTER — Ambulatory Visit
Admission: RE | Admit: 2024-11-02 | Discharge: 2024-11-02 | Disposition: A | Source: Ambulatory Visit | Attending: Student

## 2024-11-02 DIAGNOSIS — R053 Chronic cough: Secondary | ICD-10-CM

## 2024-12-07 ENCOUNTER — Encounter
